# Patient Record
Sex: Male | Born: 1955 | Race: White | Hispanic: No | Marital: Married | Smoking: Former smoker
Health system: Southern US, Community
[De-identification: ages and names within clinical notes are randomized; demographics above are authoritative.]

## PROBLEM LIST (undated history)

## (undated) DIAGNOSIS — F419 Anxiety disorder, unspecified: Secondary | ICD-10-CM

## (undated) DIAGNOSIS — Z5189 Encounter for other specified aftercare: Secondary | ICD-10-CM

## (undated) DIAGNOSIS — I8500 Esophageal varices without bleeding: Secondary | ICD-10-CM

## (undated) DIAGNOSIS — D649 Anemia, unspecified: Secondary | ICD-10-CM

## (undated) DIAGNOSIS — D126 Benign neoplasm of colon, unspecified: Secondary | ICD-10-CM

## (undated) DIAGNOSIS — I10 Essential (primary) hypertension: Secondary | ICD-10-CM

## (undated) DIAGNOSIS — M109 Gout, unspecified: Secondary | ICD-10-CM

## (undated) DIAGNOSIS — K219 Gastro-esophageal reflux disease without esophagitis: Secondary | ICD-10-CM

## (undated) DIAGNOSIS — J069 Acute upper respiratory infection, unspecified: Secondary | ICD-10-CM

## (undated) DIAGNOSIS — K7460 Unspecified cirrhosis of liver: Secondary | ICD-10-CM

## (undated) HISTORY — DX: Anemia, unspecified: D64.9

## (undated) HISTORY — PX: UPPER GI ENDOSCOPY: SHX6162

## (undated) HISTORY — DX: Anxiety disorder, unspecified: F41.9

## (undated) HISTORY — PX: OTHER SURGICAL HISTORY: SHX169

## (undated) HISTORY — DX: Benign neoplasm of colon, unspecified: D12.6

## (undated) HISTORY — DX: Gout, unspecified: M10.9

---

## 2001-09-10 ENCOUNTER — Encounter: Payer: Self-pay | Admitting: Emergency Medicine

## 2001-09-10 ENCOUNTER — Emergency Department (HOSPITAL_COMMUNITY): Admission: EM | Admit: 2001-09-10 | Discharge: 2001-09-10 | Payer: Self-pay | Admitting: Emergency Medicine

## 2008-07-07 ENCOUNTER — Encounter: Payer: Self-pay | Admitting: Emergency Medicine

## 2008-07-07 ENCOUNTER — Inpatient Hospital Stay (HOSPITAL_COMMUNITY): Admission: EM | Admit: 2008-07-07 | Discharge: 2008-07-09 | Payer: Self-pay | Admitting: Internal Medicine

## 2008-07-08 ENCOUNTER — Encounter (INDEPENDENT_AMBULATORY_CARE_PROVIDER_SITE_OTHER): Payer: Self-pay | Admitting: Internal Medicine

## 2009-02-02 ENCOUNTER — Ambulatory Visit (HOSPITAL_COMMUNITY): Admission: RE | Admit: 2009-02-02 | Discharge: 2009-02-02 | Payer: Self-pay | Admitting: Endocrinology

## 2009-02-03 ENCOUNTER — Ambulatory Visit: Admission: RE | Admit: 2009-02-03 | Discharge: 2009-02-03 | Payer: Self-pay | Admitting: Endocrinology

## 2009-02-04 ENCOUNTER — Ambulatory Visit (HOSPITAL_COMMUNITY): Admission: RE | Admit: 2009-02-04 | Discharge: 2009-02-04 | Payer: Self-pay | Admitting: Endocrinology

## 2009-05-20 ENCOUNTER — Emergency Department (HOSPITAL_COMMUNITY): Admission: EM | Admit: 2009-05-20 | Discharge: 2009-05-20 | Payer: Self-pay | Admitting: Emergency Medicine

## 2009-06-24 ENCOUNTER — Emergency Department (HOSPITAL_COMMUNITY): Admission: EM | Admit: 2009-06-24 | Discharge: 2009-06-24 | Payer: Self-pay | Admitting: Emergency Medicine

## 2009-10-01 ENCOUNTER — Emergency Department (HOSPITAL_COMMUNITY): Admission: EM | Admit: 2009-10-01 | Discharge: 2009-10-02 | Payer: Self-pay | Admitting: Emergency Medicine

## 2010-11-28 LAB — HEPATIC FUNCTION PANEL
ALT: 83 U/L — ABNORMAL HIGH (ref 0–53)
AST: 106 U/L — ABNORMAL HIGH (ref 0–37)
Albumin: 4.4 g/dL (ref 3.5–5.2)
Alkaline Phosphatase: 167 U/L — ABNORMAL HIGH (ref 39–117)
Indirect Bilirubin: 0.5 mg/dL (ref 0.3–0.9)

## 2010-11-28 LAB — URINALYSIS, ROUTINE W REFLEX MICROSCOPIC
Hgb urine dipstick: NEGATIVE
Protein, ur: NEGATIVE mg/dL
Specific Gravity, Urine: 1.01 (ref 1.005–1.030)

## 2010-11-28 LAB — ETHANOL: Alcohol, Ethyl (B): 373 mg/dL — ABNORMAL HIGH (ref 0–10)

## 2010-11-28 LAB — BASIC METABOLIC PANEL
BUN: 9 mg/dL (ref 6–23)
CO2: 20 mEq/L (ref 19–32)
Calcium: 8.8 mg/dL (ref 8.4–10.5)
Creatinine, Ser: 0.65 mg/dL (ref 0.4–1.5)
GFR calc Af Amer: >60 mL/min (ref >60)
Glucose, Bld: 137 mg/dL — ABNORMAL HIGH (ref 70–99)
Potassium: 3.8 mEq/L (ref 3.5–5.1)

## 2010-11-28 LAB — DIFFERENTIAL
Basophils Relative: 0 % (ref 0–1)
Eosinophils Absolute: 0.1 10*3/uL (ref 0.0–0.7)
Eosinophils Relative: 1 % (ref 0–5)
Lymphs Abs: 3.6 10*3/uL (ref 0.7–4.0)
Neutrophils Relative %: 47 % (ref 43–77)

## 2010-11-28 LAB — CBC
HCT: 42.5 % (ref 39.0–52.0)
RBC: 5.51 MIL/uL (ref 4.22–5.81)
WBC: 8.1 10*3/uL (ref 4.0–10.5)

## 2010-11-28 LAB — PROTIME-INR: Prothrombin Time: 15.5 seconds — ABNORMAL HIGH (ref 11.6–15.2)

## 2010-11-28 LAB — LIPASE, BLOOD: Lipase: 62 U/L — ABNORMAL HIGH (ref 11–59)

## 2010-12-16 LAB — DIFFERENTIAL
Basophils Absolute: 0 10*3/uL (ref 0.0–0.1)
Basophils Relative: 0 % (ref 0–1)
Lymphs Abs: 1.1 10*3/uL (ref 0.7–4.0)
Monocytes Absolute: 0.5 10*3/uL (ref 0.1–1.0)

## 2010-12-16 LAB — CBC
HCT: 34.9 % — ABNORMAL LOW (ref 39.0–52.0)
MCHC: 31.2 g/dL (ref 30.0–36.0)
Platelets: 97 10*3/uL — ABNORMAL LOW (ref 150–400)
RBC: 5.33 MIL/uL (ref 4.22–5.81)
WBC: 3.9 10*3/uL — ABNORMAL LOW (ref 4.0–10.5)

## 2010-12-16 LAB — COMPREHENSIVE METABOLIC PANEL
Albumin: 4.5 g/dL (ref 3.5–5.2)
Alkaline Phosphatase: 153 U/L — ABNORMAL HIGH (ref 39–117)
BUN: 7 mg/dL (ref 6–23)
CO2: 24 mEq/L (ref 19–32)
Calcium: 9.3 mg/dL (ref 8.4–10.5)
Potassium: 4 mEq/L (ref 3.5–5.1)
Sodium: 138 mEq/L (ref 135–145)
Total Bilirubin: 1.4 mg/dL — ABNORMAL HIGH (ref 0.3–1.2)
Total Protein: 7.8 g/dL (ref 6.0–8.3)

## 2010-12-16 LAB — PROTIME-INR: INR: 1.17 (ref 0.00–1.49)

## 2010-12-17 LAB — CBC
Hemoglobin: 9.9 g/dL — ABNORMAL LOW (ref 13.0–17.0)
MCHC: 30.3 g/dL (ref 30.0–36.0)
MCV: 65.5 fL — ABNORMAL LOW (ref 78.0–100.0)
RBC: 5.01 MIL/uL (ref 4.22–5.81)
WBC: 3.8 10*3/uL — ABNORMAL LOW (ref 4.0–10.5)

## 2010-12-17 LAB — BASIC METABOLIC PANEL
BUN: 8 mg/dL (ref 6–23)
Calcium: 8.9 mg/dL (ref 8.4–10.5)
Chloride: 106 mEq/L (ref 96–112)
GFR calc Af Amer: >60 mL/min (ref >60)
Glucose, Bld: 208 mg/dL — ABNORMAL HIGH (ref 70–99)

## 2010-12-17 LAB — DIFFERENTIAL
Basophils Relative: 1 % (ref 0–1)
Eosinophils Absolute: 0 10*3/uL (ref 0.0–0.7)
Eosinophils Relative: 1 % (ref 0–5)
Monocytes Absolute: 0.3 10*3/uL (ref 0.1–1.0)
Monocytes Relative: 8 % (ref 3–12)

## 2010-12-17 LAB — ETHANOL: Alcohol, Ethyl (B): <5 mg/dL (ref 0–10)

## 2010-12-17 LAB — RAPID URINE DRUG SCREEN, HOSP PERFORMED
Barbiturates: NOT DETECTED
Benzodiazepines: POSITIVE — AB
Opiates: NOT DETECTED

## 2010-12-21 LAB — CROSSMATCH: Antibody Screen: NEGATIVE

## 2011-01-25 NOTE — H&P (Signed)
NAMEMARCELLIUS, MONTAGNA NO.:  [id number]   MEDICAL RECORD NO.:  [id number]          PATIENT TYPE:  INP   LOCATION:  1425                         FACILITY:  [facility]   PHYSICIAN:  Michiel Cowboy, MDDATE OF BIRTH:  November 14, 1955   DATE OF ADMISSION:  07/07/2008  DATE OF DISCHARGE:  07/07/2008                              HISTORY & PHYSICAL   PRIMARY CARE Dyllon Henken:  Dr. Leslie Dales with [facility].   CHIEF COMPLAINT:  Chest pain, shortness of breath and dyspnea on  exertion.   Mr. Parker is a 55 year old gentleman with past medical history of  hyperlipidemia and hypertension and gout who states that for past year  or so he had not been feeling very well.  His last hemoglobin he is  convinced was checked a year ago but I cannot find anything in records,  and even on [facility]__________search.  He does report that starting from  Labor Day about 2 weeks ago he had been having exertional chest pain and  shortness of breath which has been progressively getting worse and  eventually triggered his presentation to [facility] Emergency Department  when he was found to have hemoglobin 6.8 and was admitted directly from  [facility] to [place].  Here the patient denies having any bright  red blood in his stools or black stools.  Otherwise besides chest pain,  shortness of breath had been symptomatic, he reports heavy drinking  about 4 beers in a regular day and about 12 beers on weekends, smokes  about 4 cigarettes per day.  Otherwise social history unremarkable.   PAST MEDICAL HISTORY:  As per above.   FAMILY HISTORY:  Unremarkable.   CURRENT MEDICATIONS:  Include:  1. Allopurinol 300 mg daily.  2. Citalopram 20 daily.  3. Pravastatin 40 daily.  4. Vitamin D.  5. Fish oil.   ALLERGY:  No known drug allergies.   VITALS:  Temperature 98.8.  Heart rate 85.  Respirations 18.  Satting  97% on room air.  Blood pressure 120/72 and not orthostatic.  The  patient  appears to be pale but in no acute distress.  HEAD:  Nontraumatic.  Moist mucous membranes.  LUNGS:  Clear to auscultation bilaterally.  HEART:  There is possibly a flow murmur noted and perhaps an ejection  murmur as well which is not radiating to carotids.  ABDOMEN:  Soft, nontender, nondistended, trace hepatomegaly perhaps  noted.  LOWER EXTREMITIES:  Without clubbing, cyanosis or edema.   Cranial nerves II-XII intact.  Strength 5/5 in all 4 extremities.Marland Kitchen   LABS:  White blood cell count 5.8, hemoglobin 6.1, platelets 92, sed  rate4, reticulocyte percentile 1.7 which is low for this hemoglobin.  INR slightly elevated at 1.4, sodium 138, potassium 3.9, creatinine  0.74.  LFTs within normal limits with total albumin of 4.2 and a total  protein of 6.8.  Cardiac markers within normal limits.  LDH 794.  Total  bili 1.1.  Chest x-ray showed mild cardiomegaly and basilar atelectasis.  MCV noted to be 59.9, Hemoccult. negative.  Blood review showed  possible__________.   ASSESSMENT/PLAN:  This is a 55 year old gentleman with anemia of unclear  etiology and alcohol abuse.  1. Anemia.  Differential for a cause could be iron deficiency.  We      will await results of anemia panel.  Given the very low MCV,      possibility of thalassemia also comes in.  We will need possibly      hemoglobin electrophoresis to further discern as the patient had      not been having any blood per rectum or melena.  This could be very      slow bleed since he had been feeling poorly for a year now.  Given      that this was likely a chronic situation and a heart murmur as well      as cardiomegaly on chest x-ray, there is a possibility that he      developed cardiomyopathy associated with anemia.  Will need a 2-D      echo.  We will also try to evaluate for any reason for low      platelets but those again could be related to underlying process.      We will obtain abdominal ultrasound to evaluate for  hepatomegaly      and splenomegaly given alcohol abuse, given stippling on smear,      other possibilities also involved lead poisoning or having metal      poisoning.  Will also have metal screen.  Will transfuse 2 units of      packed red blood cells.  After all this lab obtained, Hemoccult      stools was Hemoccult-positive with evidence of iron-deficiency      anemia will likely need gastrointestinal workup.  We will hold      allopurinol as this could cause aplastic anemia and could      potentially contribute to picture.  2. History of hyperlipidemia.  Check fasting lipid panel.  Continue      statin.  3. History of gout.  We will hold off allopurinol.  4. Prophylaxis.  We will do Protonix and sequential compression      devices.  Avoid Lovenox until gastrointestinal bleed ruled out.      Michiel Cowboy, MD  Electronically Signed     AVD/MEDQ  D:  07/08/2008  T:  07/08/2008  Job:  [id number]   cc:   Veverly Fells. Altheimer, M.D.  Fax: [phone]

## 2011-06-13 LAB — HEMOGLOBIN A1C
Hgb A1c MFr Bld: 6
Mean Plasma Glucose: 126

## 2011-06-13 LAB — COMPREHENSIVE METABOLIC PANEL
ALT: 18
AST: 25
Albumin: 3.9
Albumin: 4.2
Alkaline Phosphatase: 113
Alkaline Phosphatase: 99
BUN: 8
Chloride: 104
Chloride: 105
Creatinine, Ser: 0.74
GFR calc Af Amer: >60
Glucose, Bld: 96
Potassium: 3.9
Potassium: 3.9
Sodium: 136
Total Bilirubin: 1.1
Total Bilirubin: 1.3 — ABNORMAL HIGH
Total Protein: 6.7

## 2011-06-13 LAB — CBC
HCT: 21.3 — ABNORMAL LOW
HCT: 24.9 — ABNORMAL LOW
HCT: 26.4 — ABNORMAL LOW
Hemoglobin: 6.1 — CL
Hemoglobin: 7.6 — CL
Hemoglobin: 8.2 — ABNORMAL LOW
MCHC: 30.9
MCV: 66.2 — ABNORMAL LOW
Platelets: 90 — ABNORMAL LOW
RBC: 3.56 — ABNORMAL LOW
RBC: 3.99 — ABNORMAL LOW
RDW: 26.4 — ABNORMAL HIGH
WBC: 4.8
WBC: 5.5
WBC: 5.8

## 2011-06-13 LAB — CROSSMATCH: ABO/RH(D): A NEG

## 2011-06-13 LAB — HEMOGLOBINOPATHY EVALUATION
Hemoglobin Other: 0 (ref 0.0–0.0)
Hgb F Quant: 0 (ref 0.0–2.0)

## 2011-06-13 LAB — FOLATE: Folate: 18.4

## 2011-06-13 LAB — LIPID PANEL
HDL: 20 — ABNORMAL LOW
Total CHOL/HDL Ratio: 6.2
Triglycerides: 151 — ABNORMAL HIGH
VLDL: 30

## 2011-06-13 LAB — CARDIAC PANEL(CRET KIN+CKTOT+MB+TROPI)
CK, MB: 0.7
CK, MB: 0.7
Relative Index: INVALID
Total CK: 39
Troponin I: <0.01

## 2011-06-13 LAB — TSH: TSH: 2.168

## 2011-06-13 LAB — DIFFERENTIAL
Eosinophils Relative: 1
Lymphocytes Relative: 30
Lymphs Abs: 1.8
Monocytes Absolute: 0.5
Neutro Abs: 3.5

## 2011-06-13 LAB — ABO/RH: ABO/RH(D): A NEG

## 2011-06-13 LAB — RETICULOCYTES
Retic Count, Absolute: 62.7
Retic Ct Pct: 1.7

## 2011-06-13 LAB — FERRITIN: Ferritin: 5 — ABNORMAL LOW (ref 22–322)

## 2011-06-13 LAB — HEMOGLOBIN AND HEMATOCRIT, BLOOD: Hemoglobin: 7.4 — CL

## 2011-06-13 LAB — PLATELET ANTIBODIES, DIRECT: Platelet IgG Ab, Direct: NEGATIVE

## 2011-06-13 LAB — IRON AND TIBC
TIBC: 551 — ABNORMAL HIGH
UIBC: 540

## 2011-06-14 LAB — BASIC METABOLIC PANEL
CO2: 27
Chloride: 101
Creatinine, Ser: 0.8
GFR calc Af Amer: >60
Glucose, Bld: 81

## 2011-06-14 LAB — CBC
Hemoglobin: 6.8 — CL
MCHC: 29 — ABNORMAL LOW
MCV: 59 — ABNORMAL LOW
RBC: 3.94 — ABNORMAL LOW
RDW: 19.4 — ABNORMAL HIGH

## 2011-06-14 LAB — DIFFERENTIAL
Eosinophils Relative: 1
Lymphs Abs: 1.6
Monocytes Absolute: 0.7
Monocytes Relative: 13 — ABNORMAL HIGH
Neutro Abs: 2.7

## 2011-06-14 LAB — OCCULT BLOOD X 1 CARD TO LAB, STOOL: Fecal Occult Bld: NEGATIVE

## 2011-11-23 ENCOUNTER — Telehealth (HOSPITAL_COMMUNITY): Payer: Self-pay | Admitting: *Deleted

## 2011-11-23 NOTE — ED Notes (Signed)
Pt. called on VM @ 1439 and said he had a problem a few years ago with low Hgb. He has insurance and wants to get it checked. I called pt. Back and said we can check it but if it is low we would have to transfer him to the ED for blood transfusion. He said he is feeling sluggish like he did when it was low before. Denies dizziness or weakness. He said his Hbg. was 6.0 before and had to have a blood transfusion. Does not know why it got low. Denies bleeding. I told him he could come here and we could check it, but with that hx. he should probably go to ED first. Pt. States he can't go now due to work and needs someone to feed his dogs. I advised him to go ASAP. Vassie Moselle 11/23/2011

## 2011-11-29 ENCOUNTER — Emergency Department (HOSPITAL_COMMUNITY)
Admission: EM | Admit: 2011-11-29 | Discharge: 2011-11-29 | Disposition: A | Discharge status: Home / Self Care | Payer: 59 | Attending: Emergency Medicine | Admitting: Emergency Medicine

## 2011-11-29 ENCOUNTER — Encounter (HOSPITAL_COMMUNITY): Payer: Self-pay | Admitting: Emergency Medicine

## 2011-11-29 DIAGNOSIS — I10 Essential (primary) hypertension: Secondary | ICD-10-CM | POA: Insufficient documentation

## 2011-11-29 DIAGNOSIS — D509 Iron deficiency anemia, unspecified: Secondary | ICD-10-CM | POA: Insufficient documentation

## 2011-11-29 DIAGNOSIS — E119 Type 2 diabetes mellitus without complications: Secondary | ICD-10-CM | POA: Insufficient documentation

## 2011-11-29 HISTORY — DX: Essential (primary) hypertension: I10

## 2011-11-29 HISTORY — DX: Encounter for other specified aftercare: Z51.89

## 2011-11-29 LAB — DIFFERENTIAL
Basophils Relative: 0 % (ref 0–1)
Eosinophils Relative: 1 % (ref 0–5)
Lymphocytes Relative: 38 % (ref 12–46)
Monocytes Relative: 12 % (ref 3–12)
Neutro Abs: 1.5 10*3/uL — ABNORMAL LOW (ref 1.7–7.7)

## 2011-11-29 LAB — CBC
HCT: 38 % — ABNORMAL LOW (ref 39.0–52.0)
Hemoglobin: 11.3 g/dL — ABNORMAL LOW (ref 13.0–17.0)
WBC: 3.1 10*3/uL — ABNORMAL LOW (ref 4.0–10.5)

## 2011-11-29 LAB — BASIC METABOLIC PANEL
BUN: 11 mg/dL (ref 6–23)
CO2: 26 mEq/L (ref 19–32)
Chloride: 102 mEq/L (ref 96–112)
GFR calc Af Amer: >90 mL/min (ref >90)
Glucose, Bld: 177 mg/dL — ABNORMAL HIGH (ref 70–99)
Potassium: 3.8 mEq/L (ref 3.5–5.1)

## 2011-11-29 MED ORDER — FERROUS SULFATE 325 (65 FE) MG PO TABS: 325.0000 mg | ORAL_TABLET | Freq: Every day | ORAL | Status: DC

## 2011-11-29 NOTE — Discharge Instructions (Signed)
Use the prescription strength pills as prescribed. Get plenty of rest and drink fluids. Use a resource guide to find a primary care Dr.  Iron Deficiency Anemia There are many types of anemia. Iron deficiency anemia is the most common. Iron deficiency anemia is a decrease in the number of red blood cells caused by too little iron. Without enough iron, your body does not produce enough hemoglobin. Hemoglobin is a substance in red blood cells that carries oxygen to the body's tissues. Iron deficiency anemia may leave you tired and short of breath. CAUSES   Lack of iron in the diet.   This may be seen in infants and children, because there is little iron in milk.   This may be seen in adults who do not eat enough iron-rich foods.   This may be seen in pregnant or breastfeeding women who do not take iron supplements. There is a much higher need for iron intake at these times.   Poor absorption of iron, as seen with intestinal disorders.   Intestinal bleeding.   Heavy periods.  SYMPTOMS  Mild anemia may not be noticeable. Symptoms may include:  Fatigue.   Headache.   Pale skin.   Weakness.   Shortness of breath.   Dizziness.   Cold hands and feet.   Fast or irregular heartbeat.  DIAGNOSIS  Diagnosis requires a thorough evaluation and physical exam by your caregiver.  Blood tests are generally used to confirm iron deficiency anemia.   Additional tests may be done to find the underlying cause of your anemia. These may include:   Testing for blood in the stool (fecal occult blood test).   A procedure to see inside the colon and rectum (colonoscopy).   A procedure to see inside the esophagus and stomach (endoscopy).  TREATMENT   Correcting the cause of the iron deficiency is the first step.   Medicines, such as oral contraceptives, can make heavy menstrual flows lighter.   Antibiotics and other medicines can be used to treat peptic ulcers.   Surgery may be needed to  remove a bleeding polyp, tumor, or fibroid.   Often, iron supplements (ferrous sulfate) are taken.   For the best iron absorption, take these supplements with an empty stomach.   You may need to take the supplements with food if you cannot tolerate them on an empty stomach. Vitamin C improves the absorption of iron. Your caregiver may recommend taking your iron tablets with a glass of orange juice or vitamin C supplement.   Milk and antacids should not be taken at the same time as iron supplements. They may interfere with the absorption of iron.   Iron supplements can cause constipation. A stool softener is often recommended.   Pregnant and breastfeeding women will need to take extra iron, because their normal diet usually will not provide the required amount.   Patients who cannot tolerate iron by mouth can take it through a vein (intravenously) or by an injection into the muscle.  HOME CARE INSTRUCTIONS   Ask your dietitian for help with diet questions.   Take iron and vitamins as directed by your caregiver.   Eat a diet rich in iron. Eat liver, lean beef, whole-grain bread, eggs, dried fruit, and dark green leafy vegetables.  SEEK IMMEDIATE MEDICAL CARE IF:   You have a fainting episode. Do not drive yourself. Call your local emergency services (911 in [place] if no other help is available.   You have chest pain, nausea, or  vomiting.   You develop severe or increased shortness of breath with activities.   You develop weakness or increased thirst.   You have a rapid heartbeat.   You develop unexplained sweating or become lightheaded when getting up from a chair or bed.  MAKE SURE YOU:   Understand these instructions.   Will watch your condition.   Will get help right away if you are not doing well or get worse.  Document Released: 08/26/2000 Document Revised: 08/18/2011 Document Reviewed: 01/05/2010 [facility] Patient Information 2012 [place], [place].

## 2011-11-29 NOTE — ED Provider Notes (Signed)
History     CSN: [id number]  Arrival date & time 11/29/11  0730   First MD Initiated Contact with Patient 11/29/11 [phone]      Chief Complaint  Patient presents with  . Weakness    (Consider location/radiation/quality/duration/timing/severity/associated sxs/prior treatment) Patient is a 56 y.o. male presenting with weakness. The history is provided by the patient.  Weakness Primary symptoms do not include headaches, syncope, altered mental status, dizziness, visual change, focal weakness, fever, nausea or vomiting. The symptoms began more than 1 week ago. The symptoms are unchanged. The neurological symptoms are diffuse.  Additional symptoms include weakness.   he is worried that his blood count is low. He had blood transfusion several years ago. No known source for bleeding. He denies hematemesis, hematochezia, traumatic blood loss. He has an endocrinologist and had routine blood work in January 2013 with hemoglobin 10. He has no primary care doctor.  Past Medical History  Diagnosis Date  . Blood transfusion   . Hypertension   . Diabetes mellitus     History reviewed. No pertinent past surgical history.  History reviewed. No pertinent family history.  History  Substance Use Topics  . Smoking status: Not on file  . Smokeless tobacco: Not on file  . Alcohol Use: Yes      Review of Systems  Constitutional: Negative for fever.  Cardiovascular: Negative for syncope.  Gastrointestinal: Negative for nausea and vomiting.  Neurological: Positive for weakness. Negative for dizziness, focal weakness and headaches.  Psychiatric/Behavioral: Negative for altered mental status.  All other systems reviewed and are negative.    Allergies  Review of patient's allergies indicates no known allergies.  Home Medications   Current Outpatient Rx  Name Route Sig Dispense Refill  . ALLOPURINOL 300 MG PO TABS Oral Take 300 mg by mouth daily.    Marland Kitchen ALPRAZOLAM 0.5 MG PO TABS Oral Take 0.5  mg by mouth at bedtime as needed.    Marland Kitchen LOSARTAN POTASSIUM 100 MG PO TABS Oral Take 100 mg by mouth daily.    Marland Kitchen METOPROLOL SUCCINATE ER 50 MG PO TB24 Oral Take 50 mg by mouth daily. Take with or immediately following a meal.    . OMEPRAZOLE MAGNESIUM 20 MG PO TBEC Oral Take 20 mg by mouth daily.    Marland Kitchen PIOGLITAZONE HCL 30 MG PO TABS Oral Take 30 mg by mouth daily.    Marland Kitchen FERROUS SULFATE 325 (65 FE) MG PO TABS Oral Take 1 tablet (325 mg total) by mouth daily. 30 tablet 0    BP 151/79  Pulse 85  Temp(Src) 98 F (36.7 C) (Oral)  Resp 20  SpO2 100%  Physical Exam  Nursing note and vitals reviewed. Constitutional: He is oriented to person, place, and time. He appears well-developed and well-nourished.  HENT:  Head: Normocephalic and atraumatic.  Right Ear: External ear normal.  Left Ear: External ear normal.  Eyes: Conjunctivae and EOM are normal. Pupils are equal, round, and reactive to light.  Neck: Normal range of motion and phonation normal. Neck supple.  Cardiovascular: Normal rate, regular rhythm, normal heart sounds and intact distal pulses.   Pulmonary/Chest: Effort normal and breath sounds normal. He exhibits no bony tenderness.  Abdominal: Soft. Normal appearance. There is no tenderness.  Musculoskeletal: Normal range of motion.  Neurological: He is alert and oriented to person, place, and time. He has normal strength. No cranial nerve deficit or sensory deficit. He exhibits normal muscle tone. Coordination normal.  Skin: Skin is warm, dry  and intact.  Psychiatric: He has a normal mood and affect. His behavior is normal. Judgment and thought content normal.    ED Course  Procedures (including critical care time)  Labs Reviewed  CBC - Abnormal; Notable for the following:    WBC 3.1 (*)    Hemoglobin 11.3 (*)    HCT 38.0 (*)    MCV 74.4 (*)    MCH 22.1 (*)    MCHC 29.7 (*)    RDW 19.9 (*)    Platelets 102 (*)    All other components within normal limits  DIFFERENTIAL -  Abnormal; Notable for the following:    Neutro Abs 1.5 (*)    All other components within normal limits  BASIC METABOLIC PANEL - Abnormal; Notable for the following:    Glucose, Bld 177 (*)    All other components within normal limits  URINALYSIS, ROUTINE W REFLEX MICROSCOPIC   No results found.   1. Anemia, iron deficiency       MDM  Nonspecific fatigue with iron deficiency anemia. No sign for occult infection, metabolic instability or suspected central process. He is stable for discharge.  Plan: Home Medications- Iron-increased dose; Home Treatments- rest, fluids; Recommended follow up- PCP f/u asap (needs to find one)        Flint Melter, MD 11/29/11 [phone]

## 2011-11-29 NOTE — ED Notes (Signed)
Pt states that a few years ago he had this same feeling of weakness and had to receive blood. Had a colonostomy done and it was ok. States he does drink beer at times. No pain at this time.

## 2011-11-29 NOTE — ED Notes (Signed)
C/o fatigue for three months that has increased assocaiated with SOB with exertion. Denies chest pain. Pt is concerned he may have low  Hemoglobin.

## 2012-09-11 ENCOUNTER — Ambulatory Visit: Payer: 59 | Admitting: Family Medicine

## 2012-09-11 VITALS — BP 132/80 | HR 86 | Temp 99.1°F | Resp 16 | Ht 69.0 in | Wt 206.0 lb

## 2012-09-11 DIAGNOSIS — J40 Bronchitis, not specified as acute or chronic: Secondary | ICD-10-CM

## 2012-09-11 DIAGNOSIS — R05 Cough: Secondary | ICD-10-CM

## 2012-09-11 DIAGNOSIS — H6120 Impacted cerumen, unspecified ear: Secondary | ICD-10-CM

## 2012-09-11 MED ORDER — HYDROCODONE-HOMATROPINE 5-1.5 MG/5ML PO SYRP: 5.0000 mL | ORAL_SOLUTION | Freq: Four times a day (QID) | ORAL | Status: DC | PRN

## 2012-09-11 MED ORDER — AZITHROMYCIN 250 MG PO TABS: ORAL_TABLET | ORAL | Status: DC

## 2012-09-11 NOTE — Progress Notes (Signed)
Subjective:    Patient ID: Jake [name], male    DOB: 05-28-1956, 56 y.o.   MRN: [id number] Chief Complaint  Patient presents with  . Cough    productive cough x 5 days  . Fever  . Nasal Congestion    chest congestion  . Headache    HPI  Jake [name] is a 56 yo male who presents with 1 wk of acute illness. Initially thought that this was just a cold bu it is not getting any better. Sxs are especially bad at night and first thing in the morning with coughing up green mucous, burning in chest w/ coughing, incraesed pressure in eyes and in head. Has felt like fever/chills/sweats/no energy. + HAs, no sinus pressure, but feels ears popping,  Pharyngitis resolving. A little bit of achiness. Did have flu shot this yr.   Past Medical History  Diagnosis Date  . Blood transfusion   . Hypertension   . Diabetes mellitus    Current Outpatient Prescriptions on File Prior to Visit  Medication Sig Dispense Refill  . allopurinol (ZYLOPRIM) 300 MG tablet Take 300 mg by mouth daily.      . ferrous sulfate 325 (65 FE) MG tablet Take 1 tablet (325 mg total) by mouth daily.  30 tablet  0  . omeprazole (PRILOSEC OTC) 20 MG tablet Take 20 mg by mouth daily.      Marland Kitchen ALPRAZolam (XANAX) 0.5 MG tablet Take 0.5 mg by mouth at bedtime as needed.      Marland Kitchen losartan (COZAAR) 100 MG tablet Take 100 mg by mouth daily.      . metoprolol succinate (TOPROL-XL) 50 MG 24 hr tablet Take 50 mg by mouth daily. Take with or immediately following a meal.      . pioglitazone (ACTOS) 30 MG tablet Take 30 mg by mouth daily.       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 Review of Systems  Constitutional: Positive for fever, chills, diaphoresis, activity change, appetite change and fatigue. Negative for unexpected weight change.  HENT: Positive for ear pain, congestion, sore throat, rhinorrhea and postnasal drip. Negative for sneezing, neck stiffness, sinus pressure and  ear discharge.   Eyes: Positive for pain. Negative for visual disturbance.  Respiratory: Positive for cough. Negative for shortness of breath.   Cardiovascular: Positive for chest pain.  Gastrointestinal: Negative for nausea, vomiting, abdominal pain, diarrhea and constipation.  Genitourinary: Negative for dysuria.  Musculoskeletal: Positive for myalgias. Negative for joint swelling, arthralgias and gait problem.  Skin: Negative for rash.  Neurological: Positive for headaches. Negative for syncope and weakness.  Hematological: Positive for adenopathy.  Psychiatric/Behavioral: Positive for sleep disturbance.      BP 132/80  Pulse 86  Temp 99.1 F (37.3 C) (Oral)  Resp 16  Ht 5\' 9"  (1.753 m)  Wt 206 lb (93.441 kg)  BMI 30.42 kg/m2  SpO2 97% Objective:   Physical Exam  Constitutional: He is oriented to person, place, and time. He appears well-developed and well-nourished. No distress.  HENT:  Head: Normocephalic and atraumatic.  Right Ear: External ear normal.  Left Ear: External ear normal.  Nose: Mucosal edema and rhinorrhea present.  Mouth/Throat: Uvula is midline and mucous membranes are normal. Posterior oropharyngeal erythema present. No oropharyngeal exudate or posterior oropharyngeal edema.       Bilateral canals occluded w/ soft brown cerumen  Eyes: Conjunctivae normal are normal. No scleral icterus.  Neck: Normal  range of motion. Neck supple. No thyromegaly present.  Cardiovascular: Normal rate, regular rhythm, normal heart sounds and intact distal pulses.   Pulmonary/Chest: Effort normal and breath sounds normal. No respiratory distress.  Abdominal: Soft. Bowel sounds are normal. He exhibits no distension and no mass. There is no tenderness. There is no rebound and no guarding.  Musculoskeletal: He exhibits no edema.  Lymphadenopathy:       Head (right side): Submandibular adenopathy present.       Head (left side): Submandibular adenopathy present.    He has no  cervical adenopathy.       Right: No supraclavicular adenopathy present.       Left: No supraclavicular adenopathy present.  Neurological: He is alert and oriented to person, place, and time.  Skin: Skin is warm and dry. He is not diaphoretic. No erythema.  Psychiatric: He has a normal mood and affect. His behavior is normal.      Results for orders placed in visit on 09/11/12  POCT INFLUENZA A/B      Component Value Range   Influenza A, POC Negative     Influenza B, POC Negative      Assessment & Plan:   Cough - Plan: POCT Influenza A/B  Bronchitis - Plan: azithromycin (ZITHROMAX) 250 MG tablet, HYDROcodone-homatropine (HYCODAN) 5-1.5 MG/5ML syrup  Bilateral Cerumen impaction - Advise pt to return for lavage - declines today due to illness.  Meds ordered this encounter  Medications  .  azithromycin (ZITHROMAX) 250 MG tablet    Sig: Take 2 tabs PO x 1 dose, then 1 tab PO QD x 4 days    Dispense:  6 tablet    Refill:  0  .  HYDROcodone-homatropine (HYCODAN) 5-1.5 MG/5ML syrup    Sig: Take 5 mLs by mouth every 6 (six) hours as needed for cough.    Dispense:  240 mL    Refill:  0

## 2012-11-21 ENCOUNTER — Encounter (HOSPITAL_COMMUNITY): Payer: Self-pay

## 2012-11-21 ENCOUNTER — Emergency Department (HOSPITAL_COMMUNITY)
Admission: EM | Admit: 2012-11-21 | Discharge: 2012-11-22 | Disposition: A | Discharge status: Home / Self Care | Payer: 59 | Attending: Emergency Medicine | Admitting: Emergency Medicine

## 2012-11-21 DIAGNOSIS — I10 Essential (primary) hypertension: Secondary | ICD-10-CM | POA: Insufficient documentation

## 2012-11-21 DIAGNOSIS — K920 Hematemesis: Secondary | ICD-10-CM | POA: Insufficient documentation

## 2012-11-21 DIAGNOSIS — R21 Rash and other nonspecific skin eruption: Secondary | ICD-10-CM | POA: Insufficient documentation

## 2012-11-21 DIAGNOSIS — L509 Urticaria, unspecified: Secondary | ICD-10-CM | POA: Insufficient documentation

## 2012-11-21 DIAGNOSIS — Z79899 Other long term (current) drug therapy: Secondary | ICD-10-CM | POA: Insufficient documentation

## 2012-11-21 DIAGNOSIS — R112 Nausea with vomiting, unspecified: Secondary | ICD-10-CM | POA: Insufficient documentation

## 2012-11-21 DIAGNOSIS — E119 Type 2 diabetes mellitus without complications: Secondary | ICD-10-CM | POA: Insufficient documentation

## 2012-11-21 MED ORDER — FAMOTIDINE IN NACL 20-0.9 MG/50ML-% IV SOLN
20.0000 mg | Freq: Once | INTRAVENOUS | Status: AC
  Administered 2012-11-21: 20 mg via INTRAVENOUS
  Filled 2012-11-21: qty 50

## 2012-11-21 MED ORDER — DIPHENHYDRAMINE HCL 50 MG/ML IJ SOLN
25.0000 mg | Freq: Once | INTRAMUSCULAR | Status: AC
  Administered 2012-11-21: 25 mg via INTRAVENOUS
  Filled 2012-11-21: qty 1

## 2012-11-21 MED ORDER — METHYLPREDNISOLONE SODIUM SUCC 125 MG IJ SOLR
125.0000 mg | Freq: Once | INTRAMUSCULAR | Status: AC
  Administered 2012-11-21: 125 mg via INTRAVENOUS
  Filled 2012-11-21: qty 2

## 2012-11-21 MED ORDER — SODIUM CHLORIDE 0.9 % IV SOLN
INTRAVENOUS | Status: DC
  Administered 2012-11-22: via INTRAVENOUS

## 2012-11-21 NOTE — ED Notes (Signed)
MD at bedside. 

## 2012-11-21 NOTE — ED Provider Notes (Signed)
History     CSN: [id number]  Arrival date & time 11/21/12  2021   First MD Initiated Contact with Patient 11/21/12 2119      Chief Complaint  Patient presents with  . Allergic Reaction    (Consider location/radiation/quality/duration/timing/severity/associated sxs/prior treatment) HPI Comments: Jake [name] is a 57 y.o. Male who began to itch 6 days ago. He has a rash on his buttocks and legs. He also noticed that his voice was hoarse today. He took 2 Benadryl yesterday with partial relief. He did not take any Benadryl today. This evening before coming here. He ate a hamburger with onions that "tasted bad." He denies shortness of breath, wheezing, cough, fever, chills, nausea, vomiting, weakness, or dizziness. He's never had this previously. There are no other known modifying factors.  Patient is a 57 y.o. male presenting with allergic reaction. The history is provided by the patient.  Allergic Reaction    Past Medical History  Diagnosis Date  . Blood transfusion   . Hypertension   . Diabetes mellitus     History reviewed. No pertinent past surgical history.  History reviewed. No pertinent family history.  History  Substance Use Topics  . Smoking status: Never Smoker   . Smokeless tobacco: Not on file  . Alcohol Use: Yes     Comment: frequent      Review of Systems  All other systems reviewed and are negative.    Allergies  Review of patient's allergies indicates no known allergies.  Home Medications   Current Outpatient Rx  Name  Route  Sig  Dispense  Refill  . allopurinol (ZYLOPRIM) 300 MG tablet   Oral   Take 300 mg by mouth daily.         Marland Kitchen ALPRAZolam (XANAX) 0.5 MG tablet   Oral   Take 0.5 mg by mouth at bedtime as needed (sleep/anxiety).          Marland Kitchen doxylamine, Sleep, (UNISOM) 25 MG tablet   Oral   Take 25 mg by mouth at bedtime as needed (sleep/restless).         . ferrous sulfate 325 (65 FE) MG tablet   Oral   Take 325 mg by mouth  daily.         Marland Kitchen losartan (COZAAR) 100 MG tablet   Oral   Take 100 mg by mouth daily.         . metoprolol succinate (TOPROL-XL) 50 MG 24 hr tablet   Oral   Take 50 mg by mouth daily. Take with or immediately following a meal.         . Multiple Vitamin (MULTIVITAMIN WITH MINERALS) TABS   Oral   Take 1 tablet by mouth daily.         Marland Kitchen omeprazole (PRILOSEC OTC) 20 MG tablet   Oral   Take 20 mg by mouth daily.         . predniSONE (DELTASONE) 20 MG tablet   Oral   Take 1 tablet (20 mg total) by mouth 2 (two) times daily.   10 tablet   0     BP 123/70  Pulse 114  Temp(Src) 99.6 F (37.6 C) (Oral)  Resp 18  SpO2 98%  Physical Exam  Nursing note and vitals reviewed. Constitutional: He is oriented to person, place, and time. He appears well-developed and well-nourished.  HENT:  Head: Normocephalic and atraumatic.  Right Ear: External ear normal.  Left Ear: External ear normal.  Eyes: Conjunctivae and  EOM are normal. Pupils are equal, round, and reactive to light.  Neck: Normal range of motion and phonation normal. Neck supple.  Cardiovascular: Normal rate, regular rhythm, normal heart sounds and intact distal pulses.   Pulmonary/Chest: Effort normal and breath sounds normal. No respiratory distress. He has no wheezes. He has no rales. He exhibits no tenderness and no bony tenderness.  Abdominal: Soft. Normal appearance. There is no tenderness.  Musculoskeletal: Normal range of motion.  Neurological: He is alert and oriented to person, place, and time. He has normal strength. No cranial nerve deficit or sensory deficit. He exhibits normal muscle tone. Coordination normal.  Skin: Skin is warm, dry and intact.  Scattered urticaria of the lower extremities. There are some excoriations on his scalp that he states are from wearing a "tight hat".  Psychiatric: He has a normal mood and affect. His behavior is normal. Judgment and thought content normal.    ED Course   Procedures (including critical care time)  ED treatment: IV Solu-Medrol, IV, Benadryl IV Pepcid. Reevaluation: 23:45- the patient reportedly felt better after the above treatment. He then got up to walk to the bathroom, felt nauseated, came back to his room and vomited blood without food particles or gastric  Fluid. He felt better after vomiting.  01:35- he is now tolerating ice chips. Vital signs rechecked  Labs Reviewed  CBC WITH DIFFERENTIAL - Abnormal; Notable for the following:    Hemoglobin 11.7 (*)    HCT 36.7 (*)    RDW 15.6 (*)    Platelets 101 (*)    Neutrophils Relative 86 (*)    Lymphs Abs 0.6 (*)    Monocytes Relative 2 (*)    All other components within normal limits  BASIC METABOLIC PANEL - Abnormal; Notable for the following:    Sodium 133 (*)    Glucose, Bld 187 (*)    All other components within normal limits   Nursing Notes Reviewed/ Care Coordinated, and agree without changes. Applicable Imaging Reviewed Interpretation of Laboratory Data incorporated into ED treatment   1. Urticaria   2. Vomiting   3. Hematemesis      MDM  Urticaria, cause not clear. On associated vomiting x1 with blood. Because of the vomiting, is not clear. He is hemodynamically stable. He does not have known risk for gastric ulcer, esophageal varices, or bleeding of the respiratory tract or airway. Doubt metabolic instability, serious bacterial infection or impending vascular collapse; the patient is stable for discharge.    Plan: Home Medications- prednisone, Benadryl, Pepcid; Home Treatments- gradually advance diet; Recommended follow up- PCP or here, as needed.     Flint Melter, MD 11/22/12 [phone]

## 2012-11-21 NOTE — ED Notes (Addendum)
Pt ambulated to bathroom.  Upon return to room, pt became nauseated and vomited blood.  Notified MD of emesis.  Placed back on monitor.  HR elevated otherwise vitals stable.

## 2012-11-21 NOTE — ED Notes (Signed)
Per pt, started noticing itching rash on Friday.  Pt with rash to thighs, arms and back.  Pt now with changes in voice and some difficulty swallowing.  No difficulty breathing.  No new meds/foods.

## 2012-11-22 LAB — BASIC METABOLIC PANEL
CO2: 20 mEq/L (ref 19–32)
Calcium: 8.8 mg/dL (ref 8.4–10.5)
Glucose, Bld: 187 mg/dL — ABNORMAL HIGH (ref 70–99)
Sodium: 133 mEq/L — ABNORMAL LOW (ref 135–145)

## 2012-11-22 LAB — CBC WITH DIFFERENTIAL/PLATELET
Eosinophils Relative: 0 % (ref 0–5)
HCT: 36.7 % — ABNORMAL LOW (ref 39.0–52.0)
Lymphocytes Relative: 12 % (ref 12–46)
Lymphs Abs: 0.6 10*3/uL — ABNORMAL LOW (ref 0.7–4.0)
MCV: 84.6 fL (ref 78.0–100.0)
Platelets: 101 10*3/uL — ABNORMAL LOW (ref 150–400)
RBC: 4.34 MIL/uL (ref 4.22–5.81)
WBC: 4.7 10*3/uL (ref 4.0–10.5)

## 2012-11-22 MED ORDER — PREDNISONE 20 MG PO TABS: 20.0000 mg | ORAL_TABLET | Freq: Two times a day (BID) | ORAL | Status: DC

## 2012-11-22 MED ORDER — LORAZEPAM 2 MG/ML IJ SOLN
1.0000 mg | Freq: Once | INTRAMUSCULAR | Status: AC
  Administered 2012-11-22: 1 mg via INTRAVENOUS
  Filled 2012-11-22: qty 1

## 2012-11-28 ENCOUNTER — Ambulatory Visit (INDEPENDENT_AMBULATORY_CARE_PROVIDER_SITE_OTHER): Payer: 59 | Admitting: Family Medicine

## 2012-11-28 VITALS — BP 120/54 | HR 100 | Temp 97.9°F | Resp 16 | Ht 69.0 in | Wt 197.0 lb

## 2012-11-28 DIAGNOSIS — G4700 Insomnia, unspecified: Secondary | ICD-10-CM | POA: Insufficient documentation

## 2012-11-28 DIAGNOSIS — K922 Gastrointestinal hemorrhage, unspecified: Secondary | ICD-10-CM | POA: Insufficient documentation

## 2012-11-28 DIAGNOSIS — F411 Generalized anxiety disorder: Secondary | ICD-10-CM | POA: Insufficient documentation

## 2012-11-28 DIAGNOSIS — R195 Other fecal abnormalities: Secondary | ICD-10-CM

## 2012-11-28 DIAGNOSIS — I10 Essential (primary) hypertension: Secondary | ICD-10-CM

## 2012-11-28 DIAGNOSIS — E119 Type 2 diabetes mellitus without complications: Secondary | ICD-10-CM

## 2012-11-28 LAB — POCT CBC
Granulocyte percent: 62.3 % (ref 37–80)
HCT, POC: 23.6 % — AB (ref 43.5–53.7)
Hemoglobin: 6.9 g/dL — AB (ref 14.1–18.1)
Lymph, poc: 1.3 (ref 0.6–3.4)
MCH, POC: 25.4 pg — AB (ref 27–31.2)
MCHC: 29.2 g/dL — AB (ref 31.8–35.4)
MCV: 86.7 fL (ref 80–97)
MID (cbc): 0.4 (ref 0–0.9)
MPV: 9 fL (ref 0–99.8)
POC Granulocyte: 2.7 (ref 2–6.9)
POC LYMPH PERCENT: 29.3 % (ref 10–50)
POC MID %: 8.4 % (ref 0–12)
Platelet Count, POC: 159 10*3/uL (ref 142–424)
RBC: 2.72 M/uL — AB (ref 4.69–6.13)
RDW, POC: 17.6 %
WBC: 4.3 10*3/uL — AB (ref 4.6–10.2)

## 2012-11-28 LAB — POCT GLYCOSYLATED HEMOGLOBIN (HGB A1C): Hemoglobin A1C: 5.7

## 2012-11-28 LAB — TSH: TSH: 2.499 u[IU]/mL (ref 0.350–4.500)

## 2012-11-28 LAB — IFOBT (OCCULT BLOOD): IFOBT: POSITIVE

## 2012-11-28 MED ORDER — ALPRAZOLAM 0.5 MG PO TABS: 0.5000 mg | ORAL_TABLET | Freq: Every evening | ORAL | Status: DC | PRN

## 2012-11-28 MED ORDER — ZOLPIDEM TARTRATE 10 MG PO TABS: 10.0000 mg | ORAL_TABLET | Freq: Every evening | ORAL | Status: DC | PRN

## 2012-11-28 NOTE — Progress Notes (Signed)
[facility]:  Office Visit  Chief Complaint:  Chief Complaint  Patient presents with  . Insomnia  . Establish Care    PCP    HPI: Jake [name] is a 57 y.o. male who complains of multiple reasons: 1. Wants to establish care here. Previously being followed by Dr. Casimiro Needle Al[place], now with [facility]. He does not want to be with [facility].  2. Type 2 DM-He was dx 3 years ago, was eating and drinking poorly, he has not changed his diet. He does Aeronautical engineer. HBa1c? He was put on Januvia, then he was on Januvia and Actos, but Januvia was too expensive so was put only on Actos. No kidney problems. Last use of actos > 6 months. No polydipsia,polyuria.  3. Insomnia-wife gets up at 4 am to get to work by 6 am. Has problems falling asleep. He tosses and turns from side to side, he then gets up at 4 with wife regardless if he is going to work or not. He is a light sleep. He does not have problems staying asleep. He was given [place] and that worked. He has been taking OTC generic benadryl without any relief.  4. Refill on meds 5. Took Losartan 100mg  daily today, he has stopped taking metoprolol XR 2 weeks. He has some dizziness.   Past Medical History  Diagnosis Date  . Blood transfusion   . Hypertension   . Diabetes mellitus   . Anemia   . Anxiety   . Gout    Past Surgical History  Procedure Laterality Date  . Colonoscopy      2011-[facility]. Normal results per patient   History   Social History  . Marital Status: Married    Spouse Name: N/A    Number of Children: N/A  . Years of Education: N/A   Social History Main Topics  . Smoking status: Never Smoker   . Smokeless tobacco: None  . Alcohol Use: 7.2 oz/week    12 Cans of beer per week     Comment: frequent  . Drug Use: No  . Sexually Active: Yes   Other Topics Concern  . None   Social History Narrative  . None   Family History  Problem Relation Age of Onset  . Hyperlipidemia Mother    No Known  Allergies Prior to Admission medications   Medication Sig Start Date End Date Taking? Authorizing Provider  allopurinol (ZYLOPRIM) 300 MG tablet Take 300 mg by mouth daily.   Yes Historical Provider, MD  ferrous sulfate 325 (65 FE) MG tablet Take 325 mg by mouth daily. 11/29/11 11/28/12 Yes Flint Melter, MD  losartan (COZAAR) 100 MG tablet Take 100 mg by mouth daily.   Yes Historical Provider, MD  Multiple Vitamin (MULTIVITAMIN WITH MINERALS) TABS Take 1 tablet by mouth daily.   Yes Historical Provider, MD  omeprazole (PRILOSEC OTC) 20 MG tablet Take 20 mg by mouth daily.   Yes Historical Provider, MD  ALPRAZolam Prudy Feeler) 0.5 MG tablet Take 0.5 mg by mouth at bedtime as needed (sleep/anxiety).     Historical Provider, MD  doxylamine, Sleep, (UNISOM) 25 MG tablet Take 25 mg by mouth at bedtime as needed (sleep/restless).    Historical Provider, MD  metoprolol succinate (TOPROL-XL) 50 MG 24 hr tablet Take 50 mg by mouth daily. Take with or immediately following a meal.    Historical Provider, MD  predniSONE (DELTASONE) 20 MG tablet Take 1 tablet (20 mg total) by mouth 2 (two)  times daily. 11/22/12   Flint Melter, MD     ROS: The patient denies fevers, chills, night sweats, unintentional weight loss, chest pain, palpitations, wheezing, dyspnea on exertion, nausea, vomiting, abdominal pain, dysuria, hematuria, melena, numbness, weakness, or tingling.   All other systems have been reviewed and were otherwise negative with the exception of those mentioned in the HPI and as above.    PHYSICAL EXAM: Filed Vitals:   11/28/12 1440  BP: 120/54  Pulse: 100  Temp: 97.9 F (36.6 C)  Resp: 16   Filed Vitals:   11/28/12 1440  Height: 5\' 9"  (1.753 m)  Weight: 197 lb (89.359 kg)   Body mass index is 29.08 kg/(m^2).  General: Alert, no acute distress HEENT:  Normocephalic, atraumatic, oropharynx patent. EOMI, PERRLA + pale conjunctiva Cardiovascular:  Regular rate and rhythm, no rubs murmurs or  gallops.  No Carotid bruits, radial pulse intact. No pedal edema.  Respiratory: Clear to auscultation bilaterally.  No wheezes, rales, or rhonchi.  No cyanosis, no use of accessory musculature GI: No organomegaly, abdomen is soft and non-tender, positive bowel sounds.  No masses. Skin: No rashes. Neurologic: Facial musculature symmetric. Psychiatric: Patient is appropriate throughout our interaction. Lymphatic: No cervical lymphadenopathy Musculoskeletal: Gait intact.   LABS: Results for orders placed in visit on 11/28/12  POCT CBC      Result Value Range   WBC 4.3 (*) 4.6 - 10.2 K/uL   Lymph, poc 1.3  0.6 - 3.4   POC LYMPH PERCENT 29.3  10 - 50 %L   MID (cbc) 0.4  0 - 0.9   POC MID % 8.4  0 - 12 %M   POC Granulocyte 2.7  2 - 6.9   Granulocyte percent 62.3  37 - 80 %G   RBC 2.72 (*) 4.69 - 6.13 M/uL   Hemoglobin 6.9 (*) 14.1 - 18.1 g/dL   HCT, POC 11.9 (*) 14.7 - 53.7 %   MCV 86.7  80 - 97 fL   MCH, POC 25.4 (*) 27 - 31.2 pg   MCHC 29.2 (*) 31.8 - 35.4 g/dL   RDW, POC 82.9     Platelet Count, POC 159  142 - 424 K/uL   MPV 9.0  0 - 99.8 fL  POCT GLYCOSYLATED HEMOGLOBIN (HGB A1C)      Result Value Range   Hemoglobin A1C 5.7    IFOBT (OCCULT BLOOD)      Result Value Range   IFOBT Positive       EKG/XRAY:   Primary read interpreted by Dr. Conley Rolls at [facility].   ASSESSMENT/PLAN: Encounter Diagnoses  Name Primary?  Marland Kitchen Unspecified essential hypertension   . Diabetes   . Insomnia   . Acute GI bleeding Yes  . Generalized anxiety disorder   . Positive occult stool blood test     Patient declined going to Er today, he will do it tomorrow morning for acute GIB. Precautions given. Risks and benefits given.  He denies any nausea/vomiting, hematuria, melena. No recent NSAIDs.  EGD 2-3 years ago + reflux, take Prilosec 20 mg BID Colonoscopy 2-3 years ago at [facility] was normal Will hold HTN meds until etiology GIB resolved. Patient is dizzy. BP is low to normal. Hemosure was + Patient  does not have DM currently based on HbA1c, he has been off meds for over 6 months-if he had Dm then it is diet controlled Will rx Ambien for insomnia Will rx Xanax for anxiety ( 1/2 tab prn but not while  taking ambien, hold for now) F/u in 2 weeks with Dr. Clelia Croft after he gets evaluated in ER.  Patient and wife undersatnd risks and benefits, my concerns. They decline going to Er tonight for emergent evaluation of GIB but will go tomorrow AM.  Wife was advised to drive home.     Natavia Sublette PHUONG, DO 11/28/2012 5:02 PM

## 2012-11-28 NOTE — Patient Instructions (Signed)

## 2012-11-29 ENCOUNTER — Encounter (HOSPITAL_COMMUNITY): Payer: Self-pay | Admitting: *Deleted

## 2012-11-29 ENCOUNTER — Telehealth: Payer: Self-pay | Admitting: Family Medicine

## 2012-11-29 ENCOUNTER — Inpatient Hospital Stay (HOSPITAL_COMMUNITY)
Admission: EM | Admit: 2012-11-29 | Discharge: 2012-11-30 | DRG: 432 | Disposition: A | Discharge status: Home / Self Care | Payer: 59 | Attending: Internal Medicine | Admitting: Internal Medicine

## 2012-11-29 ENCOUNTER — Encounter (HOSPITAL_COMMUNITY)
Admission: EM | Disposition: A | Discharge status: Home / Self Care | Payer: Self-pay | Source: Home / Self Care | Attending: Internal Medicine

## 2012-11-29 ENCOUNTER — Encounter: Payer: Self-pay | Admitting: Family Medicine

## 2012-11-29 DIAGNOSIS — K766 Portal hypertension: Secondary | ICD-10-CM | POA: Diagnosis present

## 2012-11-29 DIAGNOSIS — Z23 Encounter for immunization: Secondary | ICD-10-CM

## 2012-11-29 DIAGNOSIS — K922 Gastrointestinal hemorrhage, unspecified: Secondary | ICD-10-CM

## 2012-11-29 DIAGNOSIS — F1020 Alcohol dependence, uncomplicated: Secondary | ICD-10-CM | POA: Diagnosis present

## 2012-11-29 DIAGNOSIS — I8500 Esophageal varices without bleeding: Secondary | ICD-10-CM

## 2012-11-29 DIAGNOSIS — D649 Anemia, unspecified: Secondary | ICD-10-CM

## 2012-11-29 DIAGNOSIS — I10 Essential (primary) hypertension: Secondary | ICD-10-CM | POA: Diagnosis present

## 2012-11-29 DIAGNOSIS — F411 Generalized anxiety disorder: Secondary | ICD-10-CM

## 2012-11-29 DIAGNOSIS — I8511 Secondary esophageal varices with bleeding: Secondary | ICD-10-CM | POA: Diagnosis present

## 2012-11-29 DIAGNOSIS — E119 Type 2 diabetes mellitus without complications: Secondary | ICD-10-CM | POA: Diagnosis present

## 2012-11-29 DIAGNOSIS — E871 Hypo-osmolality and hyponatremia: Secondary | ICD-10-CM | POA: Diagnosis present

## 2012-11-29 DIAGNOSIS — K7030 Alcoholic cirrhosis of liver without ascites: Principal | ICD-10-CM

## 2012-11-29 DIAGNOSIS — G4700 Insomnia, unspecified: Secondary | ICD-10-CM

## 2012-11-29 DIAGNOSIS — E861 Hypovolemia: Secondary | ICD-10-CM | POA: Diagnosis present

## 2012-11-29 DIAGNOSIS — D62 Acute posthemorrhagic anemia: Secondary | ICD-10-CM

## 2012-11-29 DIAGNOSIS — K7460 Unspecified cirrhosis of liver: Secondary | ICD-10-CM

## 2012-11-29 DIAGNOSIS — R1310 Dysphagia, unspecified: Secondary | ICD-10-CM | POA: Diagnosis present

## 2012-11-29 DIAGNOSIS — Z79899 Other long term (current) drug therapy: Secondary | ICD-10-CM

## 2012-11-29 DIAGNOSIS — M109 Gout, unspecified: Secondary | ICD-10-CM | POA: Diagnosis present

## 2012-11-29 DIAGNOSIS — D61818 Other pancytopenia: Secondary | ICD-10-CM

## 2012-11-29 HISTORY — DX: Unspecified cirrhosis of liver: K74.60

## 2012-11-29 HISTORY — PX: ESOPHAGOGASTRODUODENOSCOPY: SHX5428

## 2012-11-29 HISTORY — DX: Esophageal varices without bleeding: I85.00

## 2012-11-29 LAB — CBC WITH DIFFERENTIAL/PLATELET
Basophils Relative: 0 % (ref 0–1)
Eosinophils Absolute: 0 10*3/uL (ref 0.0–0.7)
Eosinophils Relative: 1 % (ref 0–5)
MCH: 28.2 pg (ref 26.0–34.0)
MCHC: 32.8 g/dL (ref 30.0–36.0)
MCV: 85.9 fL (ref 78.0–100.0)
Neutrophils Relative %: 58 % (ref 43–77)
Platelets: 121 10*3/uL — ABNORMAL LOW (ref 150–400)
RDW: 17.7 % — ABNORMAL HIGH (ref 11.5–15.5)

## 2012-11-29 LAB — COMPREHENSIVE METABOLIC PANEL WITH GFR
Alkaline Phosphatase: 188 U/L — ABNORMAL HIGH (ref 39–117)
BUN: 12 mg/dL (ref 6–23)
Glucose, Bld: 131 mg/dL — ABNORMAL HIGH (ref 70–99)
Sodium: 135 meq/L (ref 135–145)
Total Bilirubin: 1.1 mg/dL (ref 0.3–1.2)

## 2012-11-29 LAB — COMPREHENSIVE METABOLIC PANEL
ALT: 90 U/L — ABNORMAL HIGH (ref 0–53)
ALT: 78 U/L — ABNORMAL HIGH (ref 0–53)
AST: 99 U/L — ABNORMAL HIGH (ref 0–37)
Albumin: 3.8 g/dL (ref 3.5–5.2)
Albumin: 3.1 g/dL — ABNORMAL LOW (ref 3.5–5.2)
Alkaline Phosphatase: 190 U/L — ABNORMAL HIGH (ref 39–117)
CO2: 24 mEq/L (ref 19–32)
Calcium: 8.3 mg/dL — ABNORMAL LOW (ref 8.4–10.5)
Calcium: 8.2 mg/dL — ABNORMAL LOW (ref 8.4–10.5)
Chloride: 103 mEq/L (ref 96–112)
Creat: 0.7 mg/dL (ref 0.50–1.35)
GFR calc Af Amer: >90 mL/min (ref >90)
Glucose, Bld: 199 mg/dL — ABNORMAL HIGH (ref 70–99)
Potassium: 3.8 mEq/L (ref 3.5–5.3)
Potassium: 3.7 mEq/L (ref 3.5–5.1)
Sodium: 131 mEq/L — ABNORMAL LOW (ref 135–145)
Total Protein: 6.2 g/dL (ref 6.0–8.3)
Total Protein: 6.2 g/dL (ref 6.0–8.3)

## 2012-11-29 LAB — PREPARE RBC (CROSSMATCH)

## 2012-11-29 LAB — MRSA PCR SCREENING: MRSA by PCR: NEGATIVE

## 2012-11-29 LAB — CBC
HCT: 22.8 % — ABNORMAL LOW (ref 39.0–52.0)
MCH: 27.9 pg (ref 26.0–34.0)
MCHC: 32 g/dL (ref 30.0–36.0)
RDW: 16.9 % — ABNORMAL HIGH (ref 11.5–15.5)

## 2012-11-29 LAB — PROTIME-INR: Prothrombin Time: 16.4 seconds — ABNORMAL HIGH (ref 11.6–15.2)

## 2012-11-29 SURGERY — EGD (ESOPHAGOGASTRODUODENOSCOPY)
Anesthesia: Moderate Sedation

## 2012-11-29 MED ORDER — DIPHENHYDRAMINE HCL 50 MG/ML IJ SOLN
INTRAMUSCULAR | Status: DC | PRN
  Administered 2012-11-29: 50 mg via INTRAVENOUS

## 2012-11-29 MED ORDER — FENTANYL CITRATE 0.05 MG/ML IJ SOLN
INTRAMUSCULAR | Status: DC | PRN
  Administered 2012-11-29 (×4): 25 ug via INTRAVENOUS

## 2012-11-29 MED ORDER — ACETAMINOPHEN 650 MG RE SUPP: 650.0000 mg | Freq: Four times a day (QID) | RECTAL | Status: DC | PRN

## 2012-11-29 MED ORDER — DIPHENHYDRAMINE HCL 50 MG/ML IJ SOLN
INTRAMUSCULAR | Status: AC
  Filled 2012-11-29: qty 1

## 2012-11-29 MED ORDER — SODIUM CHLORIDE 0.9 % IV SOLN
50.0000 ug/h | INTRAVENOUS | Status: DC
  Administered 2012-11-29: 50 ug/h via INTRAVENOUS
  Filled 2012-11-29 (×2): qty 1

## 2012-11-29 MED ORDER — OMEPRAZOLE MAGNESIUM 20 MG PO TBEC: 20.0000 mg | DELAYED_RELEASE_TABLET | Freq: Every day | ORAL | Status: DC

## 2012-11-29 MED ORDER — MIDAZOLAM HCL 10 MG/2ML IJ SOLN
INTRAMUSCULAR | Status: DC | PRN
  Administered 2012-11-29 (×3): 2.5 mg via INTRAVENOUS

## 2012-11-29 MED ORDER — FENTANYL CITRATE 0.05 MG/ML IJ SOLN
INTRAMUSCULAR | Status: AC
  Filled 2012-11-29: qty 4

## 2012-11-29 MED ORDER — SODIUM CHLORIDE 0.9 % IV SOLN
1000.0000 mL | Freq: Once | INTRAVENOUS | Status: AC
  Administered 2012-11-29: 1000 mL via INTRAVENOUS

## 2012-11-29 MED ORDER — SODIUM CHLORIDE 0.9 % IJ SOLN
3.0000 mL | Freq: Two times a day (BID) | INTRAMUSCULAR | Status: DC
  Administered 2012-11-29 – 2012-11-30 (×2): 3 mL via INTRAVENOUS

## 2012-11-29 MED ORDER — FERROUS SULFATE 325 (65 FE) MG PO TABS
325.0000 mg | ORAL_TABLET | Freq: Every day | ORAL | Status: DC
Start: 2012-11-30 — End: 2012-11-30
  Administered 2012-11-30: 325 mg via ORAL
  Filled 2012-11-29 (×2): qty 1

## 2012-11-29 MED ORDER — SODIUM CHLORIDE 0.9 % IV SOLN: INTRAVENOUS | Status: DC

## 2012-11-29 MED ORDER — BUTAMBEN-TETRACAINE-BENZOCAINE 2-2-14 % EX AERO
INHALATION_SPRAY | CUTANEOUS | Status: DC | PRN
  Administered 2012-11-29: 2 via TOPICAL

## 2012-11-29 MED ORDER — PNEUMOCOCCAL VAC POLYVALENT 25 MCG/0.5ML IJ INJ
0.5000 mL | INJECTION | INTRAMUSCULAR | Status: DC
  Filled 2012-11-29 (×2): qty 0.5

## 2012-11-29 MED ORDER — MIDAZOLAM HCL 10 MG/2ML IJ SOLN
INTRAMUSCULAR | Status: AC
  Filled 2012-11-29: qty 4

## 2012-11-29 MED ORDER — ZOLPIDEM TARTRATE 10 MG PO TABS
10.0000 mg | ORAL_TABLET | Freq: Every evening | ORAL | Status: DC | PRN
  Administered 2012-11-29: 10 mg via ORAL
  Filled 2012-11-29: qty 1

## 2012-11-29 MED ORDER — MORPHINE SULFATE 2 MG/ML IJ SOLN
2.0000 mg | INTRAMUSCULAR | Status: DC | PRN
  Administered 2012-11-30 (×2): 2 mg via INTRAVENOUS
  Filled 2012-11-29 (×2): qty 1

## 2012-11-29 MED ORDER — ALLOPURINOL 300 MG PO TABS
300.0000 mg | ORAL_TABLET | Freq: Every day | ORAL | Status: DC
Start: 2012-11-30 — End: 2012-11-30
  Administered 2012-11-30: 300 mg via ORAL
  Filled 2012-11-29: qty 1

## 2012-11-29 MED ORDER — ADULT MULTIVITAMIN W/MINERALS CH
1.0000 | ORAL_TABLET | Freq: Every day | ORAL | Status: DC
  Administered 2012-11-29 – 2012-11-30 (×2): 1 via ORAL
  Filled 2012-11-29 (×2): qty 1

## 2012-11-29 MED ORDER — OCTREOTIDE LOAD VIA INFUSION
50.0000 ug | Freq: Once | INTRAVENOUS | Status: AC
  Administered 2012-11-29: 50 ug via INTRAVENOUS
  Filled 2012-11-29: qty 25

## 2012-11-29 MED ORDER — PANTOPRAZOLE SODIUM 40 MG PO TBEC
40.0000 mg | DELAYED_RELEASE_TABLET | Freq: Every day | ORAL | Status: DC
  Administered 2012-11-30: 40 mg via ORAL
  Filled 2012-11-29: qty 1

## 2012-11-29 MED ORDER — VITAMIN B-1 100 MG PO TABS
100.0000 mg | ORAL_TABLET | Freq: Every day | ORAL | Status: DC
  Administered 2012-11-29 – 2012-11-30 (×2): 100 mg via ORAL
  Filled 2012-11-29 (×2): qty 1

## 2012-11-29 MED ORDER — ACETAMINOPHEN 325 MG PO TABS
650.0000 mg | ORAL_TABLET | Freq: Four times a day (QID) | ORAL | Status: DC | PRN
  Filled 2012-11-29: qty 2

## 2012-11-29 NOTE — ED Notes (Signed)
Pt seen at [facility] last night and told hemoglobin was 6; states has been dizzy with no energy; denies black or tarry stools

## 2012-11-29 NOTE — Op Note (Signed)
[facility] [address] [place], [address]   ENDOSCOPY PROCEDURE REPORT  PATIENT: Jake [name]  MR#: [id number] BIRTHDATE: 18-Aug-1956 , 56  yrs. old GENDER: Male ENDOSCOPIST:Fender Herder, MD REFERRED BY:  [facility] PROCEDURE DATE:  11/29/2012 PROCEDURE:       upper endoscopy with variceal band ligation ASA CLASS: INDICATIONS:   hematemesis x 1 a week ago, not requiring hospitalization, but with a 5 g drop in hemoglobin in the interim, without clinical evidence of further bleeding MEDICATION:  Benadryl 50 mg, fentanyl 100 mcg IV, Versed 7.5 mg IV TOPICAL ANESTHETIC:    Cetacaine spray  DESCRIPTION OF PROCEDURE:   the patient was brought from the [facility] emergency room to the endoscopy unit and sedated with the above medications, remaining clinically stable throughout the procedure.  The Pentax adult video endoscope was passed under direct vision. The vocal cords looked normal and the esophagus was readily entered.  The proximal half of the esophagus was normal, but the distal half of the esophagus had significant varices. In the mid-esophagus, the varices were medium in size, whereas distally, they were medium-to-large. There were a number of small red dots on the varices but no specific or discrete stigmata of recent hemorrhage, and certainly no evidence of active bleeding.  The esophagus was free of reflux esophagitis, Barrett's esophagus, infection, neoplasia, or any stricture or hiatal hernia.  The stomach was entered. It contained no blood or coffee-ground material. The gastric mucosa was essentially normal, with perhaps some subtle changes of portal congestive gastropathy in the fundic region, characterized by some minimal snake skinning changes and contact hemorrhage. No erosions, ulcers, polyps, or masses were seen, and a retroflexed view of the cardia was normal, specifically without evidence of gastric varices.  The  pylorus, duodenal bulb, and second duodenum looked normal.  I elected to proceed with variceal band ligation, in view of the significant drop in hemoglobin over the past week, the documented history of hematemesis when the patient was coincidentally in the emergency room a week ago for treatment of a skin rash, and the absence of alternative sources of bleeding on today's exam. This had been discussed with the patient and he had been agreeable to undergoing banding if appropriate.  A total of 4 bands were placed on varices in the distal esophagus. Proximal to that, the varices were not quite large enough, in my opinion, to merit further banding. Therefore, the scope was removed from the patient tolerated the procedure well.     COMPLICATIONS: None  ENDOSCOPIC IMPRESSION:  1. No active bleeding or blood in the stomach at the time this exam 2. Moderately large esophageal varices, larger (by description) than those encountered by Dr. Evette Cristal 4 years ago 3. Otherwise normal upper endoscopy 4. Banding of large, distal varices performed as described above (4 bands placed)  RECOMMENDATIONS:  1. I would favor overnight observation in view of his recent bleeding, although it is not absolutely essential since the patient was stable on admission apart from his anemia, and showed no stigmata of recent hemorrhage. Variceal banding in and of itself is not a mandate for postprocedural overnight hospitalization. 2. Given the absence of any active bleeding, I feel that his octreotide can be stopped at any time 3. the patient should followup with his primary gastroenterologist, Dr. Wandalee Ferdinand, in the next several weeks to consider having further variceal banding performed, until the varices are obliterated 4. The patient needs to abstain from alcohol  5. The patient does not seem to have a lot of insight into his disease, and seemingly was not aware that he had any liver condition, even though  his CT 4 years ago showed cirrhotic changes, splenomegaly, and he had varices on endoscopy at that time. He clearly needs patient education    _______________________________ eSigned:  Bernette Redbird, MD 11/29/2012 3:32 PM    PATIENT NAME:  Jake [name], Jake [name] MR#: [id number]

## 2012-11-29 NOTE — ED Notes (Signed)
EKG given to EDP, Otter, MD. 

## 2012-11-29 NOTE — ED Provider Notes (Signed)
Medical screening examination/treatment/procedure(s) were conducted as a shared visit with non-physician practitioner(s) and myself.  I personally evaluated the patient during the encounter  Flint Melter, MD 11/29/12 2142

## 2012-11-29 NOTE — Telephone Encounter (Signed)
Checked that he did in fact go to the ER. Left message for him to return to office to see Dr. Clelia Croft in 2 weeks after he gets all his acute issues from GIB and chronic elevated LFTs  evaluated.

## 2012-11-29 NOTE — Consult Note (Signed)
Referring Provider: Dr Mancel Bale Primary Care Physician:  Rockne Coons, DO Primary Gastroenterologist:  Dr. Evette Cristal  Reason for Consultation:  GI bleeding  HPI: Jake [name] is a 57 y.o. male admitted through the emergency room today because of the marked drop in hemoglobin. The patient was seen in the emergency room a week ago because of a skin rash, and was preparing to go home from the emergency room when, out of the blue, he had significant hematemesis. That resolved spontaneously, the patient is clinically stable, his hemoglobin was 11.7, and he was released from the emergency room. He was then seen for routine initiation of care at the urgent medical care center yesterday and found to have a low hemoglobin, around 6.9, and was directed to the emergency room. He did not come in immediately but rather chose to wait until this morning to come to the ER, where his hemoglobin was found to be 6.2, with heme positive but brown, nonmalignant next stool and a normal BUN of 11. He was hemodynamically stable and in no distress but in view of the significant documented drop in hemoglobin over the past week, inpatient evaluation and care, including transfusion, was felt to be appropriate. Interestingly, other than a solitary episode of hematemesis, the patient really has no GI symptoms and no other recognizable GI bleeding. He's been on omeprazole for the past 4 years because of GERD, also takes iron. He has only sporadic usage of aspirin and nonsteroidal anti-inflammatory drugs. He does drink about a sixpack of beer once a week. Of note, endoscopy and colonoscopy were performed by Dr. Wandalee Ferdinand about 4 years ago in April of 2010, at which time the patient was found to have 1+ varices. A CT scan around that time showed a nodular liver consistent with cirrhosis, as well as splenomegaly.   Past Medical History  Diagnosis Date  . Blood transfusion   . Hypertension   . Diabetes mellitus   . Anemia   .  Anxiety   . Gout   . Esophageal varices 11/29/12    Historical  . Cirrhosis 11/29/12    historical    Past Surgical History  Procedure Laterality Date  . Colonoscopy      2011-[facility]. Normal results per patient    Prior to Admission medications   Medication Sig Start Date End Date Taking? Authorizing Provider  allopurinol (ZYLOPRIM) 300 MG tablet Take 300 mg by mouth daily.   Yes Historical Provider, MD  ALPRAZolam Prudy Feeler) 0.5 MG tablet Take 1 tablet (0.5 mg total) by mouth at bedtime as needed (sleep/anxiety). 11/28/12  Yes Thao P Le, DO  ferrous sulfate 325 (65 FE) MG tablet Take 325 mg by mouth daily with breakfast.   Yes Historical Provider, MD  losartan (COZAAR) 100 MG tablet Take 100 mg by mouth daily.   Yes Historical Provider, MD  metoprolol succinate (TOPROL-XL) 50 MG 24 hr tablet Take 50 mg by mouth daily. Take with or immediately following a meal.   Yes Historical Provider, MD  Multiple Vitamin (MULTIVITAMIN WITH MINERALS) TABS Take 1 tablet by mouth daily.   Yes Historical Provider, MD  omeprazole (PRILOSEC OTC) 20 MG tablet Take 20 mg by mouth daily.   Yes Historical Provider, MD  zolpidem (AMBIEN) 10 MG tablet Take 1 tablet (10 mg total) by mouth at bedtime as needed for sleep. 11/28/12 12/28/12 Yes Thao P Le, DO    Current Facility-Administered Medications  Medication Dose Route Frequency Provider Last Rate Last  Dose  . 0.9 %  sodium chloride infusion   Intravenous Continuous Katy Fitch Fannye Myer, MD      . octreotide (SANDOSTATIN) 2 mcg/mL in sodium chloride 0.9 % 250 mL infusion  50 mcg/hr Intravenous Continuous Flint Melter, MD 25 mL/hr at 11/29/12 1100 50 mcg/hr at 11/29/12 1100    Allergies as of 11/29/2012  . (No Known Allergies)    Family History  Problem Relation Age of Onset  . Hyperlipidemia Mother     History   Social History  . Marital Status: Married    Spouse Name: N/A    Number of Children: N/A  . Years of Education: N/A   Occupational  History  . Not on file.   Social History Main Topics  . Smoking status: Never Smoker   . Smokeless tobacco: Not on file  . Alcohol Use: 7.2 oz/week    12 Cans of beer per week     Comment: frequent  . Drug Use: No  . Sexually Active: Yes   Other Topics Concern  . Not on file   Social History Narrative  . No narrative on file    Review of Systems: See history of present illness. No weight loss, no abdominal pain or reflux symptoms, no nausea or dyspepsia, no melenic stools and Physical Exam: Vital signs in last 24 hours: Temp:  [97.6 F (36.4 C)-98.3 F (36.8 C)] 98.3 F (36.8 C) (03/20 1300) Pulse Rate:  [91-129] 98 (03/20 1300) Resp:  [15-23] 15 (03/20 1300) BP: (106-129)/(54-93) 129/93 mmHg (03/20 1300) SpO2:  [97 %-100 %] 98 % (03/20 1300) Weight:  [89.359 kg (197 lb)] 89.359 kg (197 lb) (03/19 1440)   General:   Alert,  Well-developed, well-nourished, pleasant and cooperative in NAD Head:  Normocephalic and atraumatic. Eyes:  Sclera clear, no icterus.   Conjunctiva pink. Mouth:   No ulcerations or lesions.  Oropharynx pink & moist. Lungs:  Clear throughout to auscultation.   No wheezes, crackles, or rhonchi. No evident respiratory distress. Heart:   Regular rate and rhythm; no murmurs, clicks, rubs,  or gallops. Abdomen:  Soft, nontender, nontympanitic, and nondistended. No masses, no discernible hepatosplenomegaly or ventral hernias noted. No guarding, or rebound.   Rectal: Brown heme positive stool by ER physician Msk:   Symmetrical without gross deformities. Neurologic:  Alert and coherent;  grossly normal neurologically. Skin: No spiders. Residual rash present on his face Psych:   Alert and cooperative. Normal mood and affect.  Intake/Output from previous day:   Intake/Output this shift: Total I/O In: 2325 [I.V.:2000; Blood:325] Out: 500 [Urine:500]  Lab Results:  Recent Labs  11/28/12 1611 11/29/12 0635  WBC 4.3* 3.2*  HGB 6.9* 6.2*  HCT 23.6*  18.9*  PLT  --  121*   BMET  Recent Labs  11/28/12 1608 11/29/12 0635  NA 135 131*  K 3.8 3.7  CL 103 99  CO2 24 21  GLUCOSE 131* 199*  BUN 12 11  CREATININE 0.70 0.70  CALCIUM 8.3* 8.2*   LFT  Recent Labs  11/29/12 0635  PROT 6.2  ALBUMIN 3.1*  AST 76*  ALT 78*  ALKPHOS 190*  BILITOT 0.8   PT/INR  Recent Labs  11/29/12 0635  LABPROT 16.4*  INR 1.35    Studies/Results: No results found.  Impression: 1. Recent episode of self-limited hematemesis  2. Significant drop in hemoglobin without overt bleeding  3. History of significant ethanol consumption, heavier in the past, with radiographic evidence of cirrhosis 4 years  ago and small varices present on endoscopy at that time  Plan: Endoscopic evaluation. Ashby Dawes purpose risks reviewed, with particular reference to possibility of recent bleeding if variceal treatment is needed   LOS: 0 days   Jake [name] V  11/29/2012, 2:25 PM

## 2012-11-29 NOTE — ED Provider Notes (Signed)
Jake [name] is a 57 y.o. male who is here to evaluate for anemia. He went to urgent care yesterday, "to get a primary care doctor "; was evaluated and found to be anemic. He is encouraged to come immediately to the ED, but decided to wait until this morning to come. He had run out of his Xanax and Ambien, was given a prescription for same, and slept well last night. In the week since he was here for a rash, the rash has improved, and he is having trouble sleeping. He has not had anymore nausea, vomiting, or diarrhea. He thinks his stool was somewhat darker than usual. He has had some dizziness on standing. He is anxious to get a blood transfusion, go home and return to work tomorrow.   Exam alert, cooperative. Serial vital signs improved. 0839 hours. Blood pressure 116/65 with pulse 106.  Abdomen soft and nontender Neurologic grossly nonfocal.   ED treatment: 2 units RBC, ordered  Contact GI to arrange for timing, of upper endoscopy. 10:30- case discussed with Dr. Matthias Hughs, he asked that I start octreotide keep the patient n.p.o. and he will arrange for endoscopy. He is concerned that the patient had a self-limited variceal bleed. He notes that on prior evaluation 4 years ago, the patient had grade 1 esophageal varices, and cirrhotic liver. Patient remains comfortable and stable, with reassuring vital signs, being checked frequently. Blood infusing, now    CRITICAL CARE Performed by: Mancel Bale L   Total critical care time: 45 minutes  Critical care time was exclusive of separately billable procedures and treating other patients.  Critical care was necessary to treat or prevent imminent or life-threatening deterioration.  Critical care was time spent personally by me on the following activities: development of treatment plan with patient and/or surrogate as well as nursing, discussions with consultants, evaluation of patient's response to treatment, examination of patient, obtaining  history from patient or surrogate, ordering and performing treatments and interventions, ordering and review of laboratory studies, ordering and review of radiographic studies, pulse oximetry and re-evaluation of patient's condition.   Medical screening examination/treatment/procedure(s) were conducted as a shared visit with non-physician practitioner(s) and myself.  I personally evaluated the patient during the encounter     16:40- he has returned, from endoscopy. I discussed the case with Dr. Matthias Hughs. He felt the procedure went well. The patient required banding of esophageal varices. His varices have progressed since 2014. There was no active bleeding. Dr. Matthias Hughs felt that the patient needed to be observed overnight in the step-down unit. The patient is now agreeable to staying overnight. He has received 2 units of packed red blood cells. He will be admitted to a medical service.  Flint Melter, MD 11/29/12 2142

## 2012-11-29 NOTE — Progress Notes (Signed)
Pt in room w/o any s/s of distress of pain or distress. Pt is able to make needs known. Will continue to monitor.

## 2012-11-29 NOTE — ED Notes (Signed)
WUJ:[id number]<JY> Expected date:<BR> Expected time:<BR> Means of arrival:<BR> Comments:<BR> Hold for [place]

## 2012-11-29 NOTE — Progress Notes (Signed)
[facility] History and Physical  [facility] BROADWELL JYN:[id number] DOB: 21-Oct-1955 DOA: 11/29/2012   PCP: Rockne Coons, DO   Chief Complaint: anemia, GI bleed  HPI:  57 year old male with a history of alcohol abuse and cirrhosis of the liver, hypertension, hyperglycemia, and esophageal varices presented today to the emergency department after he was informed by his urgent care physician that his hemoglobin was 6.9. The patient denies any fevers, chills, chest pain, shortness of breath, palpitations, nausea, vomiting, diarrhea, abdominal pain, hematochezia, melena. He infrequently uses NSAIDs. He denies any hemoptysis or hematuria.  As the history goes, the patient presented to the emergency department on 11/22/2012 with a rash. Prior to being discharged from the emergency department, the patient had an episode of hematemesis. Because he remained hemodynamically stable, the patient was discharged from the emergency department told to followup with his primary care physician. The patient went to the urgent care Center yesterday, 11/28/2012, where he had blood work performed. He was called today because of her low hemoglobin. In emergency department today, the patient was found to have a hemoglobin of 6.2. On 11/22/2012, his hemoglobin was 11.7. The patient has a history of esophageal varices that were banded approximately 4 years ago. Upon presentation, the patient remained hemodynamically stable with a systolic blood pressure of 120. However he was tachycardic in the 120s. Dr. Marjorie Smolder from the GI service was consulted to see the patient. The patient was taken to endoscopy where he had banding of his esophageal varices x4 today. There was no active bleeding noted on endoscopy today. The patient has remained hemodynamically stable throughout the day. He received 2 units PRBCs in the emergency department. [facility] has been asked to admit the patient overnight for observation due to the severity of his  medical condition. Of note, the patient also has pancytopenia with WBC 3.2, platelets 121,000. He was also noted to have mild hyponatremia with a sodium of 131.   Assessment/Plan: Acute upper GI bleed -Secondary to esophageal varices -Status post variceal banding x4 by Dr. 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 -The patient last drank alcohol on 11/22/2012 -Long discussion with the patient and his wife regarding cessation -He appears to have poor insight into his medical condition Hypertension -Patient's blood pressure has been soft in the emergency department although he is stable -Hold losartan and metoprolol tartrate at this time and monitor Pancytopenia -Likely due to the patient's liver cirrhosis -The patient was previously noted to have splenomegaly Hyperglycemia/impaired glucose tolerance -Hemoglobin A1c 5.7 on 11/28/2012 -The patient is not on any oral hypoglycemic agents at this time  Alcohol abuse  -patient's last drink was 11/22/2012 - start patient on thiamine Mild hyponatremia -likely hypovolemia -recheck bmp in am -pt received 2 units PRBC  -TSH       Past Medical History  Diagnosis Date  . Blood transfusion   . Hypertension   . Diabetes mellitus   . Anemia   . Anxiety   . Gout   . Esophageal varices 11/29/12    Historical  . Cirrhosis 11/29/12    historical   Past Surgical History  Procedure Laterality Date  . Colonoscopy      2011-[facility]. Normal  results per patient  . Upper gi endoscopy     Social History:  reports that he has never smoked. He has never used smokeless tobacco. He reports that he drinks about 7.2 ounces of  alcohol per week. He reports that he does not use illicit drugs.   Family History  Problem Relation Age of Onset  . Hyperlipidemia Mother      No Known Allergies    Prior to Admission medications   Medication Sig Start Date End Date Taking? Authorizing Provider  allopurinol (ZYLOPRIM) 300 MG tablet Take 300 mg by mouth daily.   Yes Historical Provider, MD  ALPRAZolam Prudy Feeler) 0.5 MG tablet Take 1 tablet (0.5 mg total) by mouth at bedtime as needed (sleep/anxiety). 11/28/12  Yes Thao P Le, DO  ferrous sulfate 325 (65 FE) MG tablet Take 325 mg by mouth daily with breakfast.   Yes Historical Provider, MD  losartan (COZAAR) 100 MG tablet Take 100 mg by mouth daily.   Yes Historical Provider, MD  metoprolol succinate (TOPROL-XL) 50 MG 24 hr tablet Take 50 mg by mouth daily. Take with or immediately following a meal.   Yes Historical Provider, MD  Multiple Vitamin (MULTIVITAMIN WITH MINERALS) TABS Take 1 tablet by mouth daily.   Yes Historical Provider, MD  omeprazole (PRILOSEC OTC) 20 MG tablet Take 20 mg by mouth daily.   Yes Historical Provider, MD  zolpidem (AMBIEN) 10 MG tablet Take 1 tablet (10 mg total) by mouth at bedtime as needed for sleep. 11/28/12 12/28/12 Yes Thao P Le, DO    Review of Systems:  Constitutional:  No weight loss, night sweats, Fevers, chills, fatigue.  Head&Eyes: No headache.  No vision loss.  No eye pain or scotoma ENT:  No Difficulty swallowing,Tooth/dental problems,Sore throat,  No ear ache, post nasal drip,  Cardio-vascular:  No chest pain, Orthopnea, PND, swelling in lower extremities,  dizziness, palpitations  GI:  No  abdominal pain, nausea, vomiting, diarrhea, loss of appetite, hematochezia, melena, heartburn, indigestion, Resp:  No shortness of breath with exertion or at rest. No cough. No coughing up of blood .No wheezing.No chest wall deformity  Skin:  no rash or lesions.  GU:  no dysuria, change in color of urine, no urgency or frequency. No  flank pain.  Musculoskeletal:  No joint pain or swelling. No decreased range of motion. No back pain.  Psych:  No change in mood or affect. No depression or anxiety. Neurologic: No headache, no dysesthesia, no focal weakness, no vision loss. No syncope  Physical Exam: Filed Vitals:   11/29/12 1540 11/29/12 1550 11/29/12 1700 11/29/12 1715  BP: 116/67 108/61  132/82  Pulse:   92 96  Temp:      TempSrc:      Resp: 15 15  20   SpO2: 99% 100% 97% 100%   General:  A&O x 3, NAD, nontoxic, pleasant/cooperative Head/Eye: No conjunctival hemorrhage, no icterus, [place]/AT, No nystagmus ENT:  No icterus,  No thrush, good dentition, no pharyngeal exudate Neck:  No masses, no lymphadenpathy, no bruits CV:  RRR, no rub, no gallop, no S3 Lung:  CTAB, good air movement, no wheeze, no rhonchi Abdomen: soft/NT, +BS, nondistended, no peritoneal signs Ext: No cyanosis, No rashes, No petechiae, No lymphangitis, No edema   Labs on Admission:  Basic Metabolic Panel:  Recent Labs Lab 11/28/12 1608 11/29/12 0635  NA 135 131*  K 3.8 3.7  CL 103 99  CO2 24 21  GLUCOSE 131* 199*  BUN 12  11  CREATININE 0.70 0.70  CALCIUM 8.3* 8.2*   Liver Function Tests:  Recent Labs Lab 11/28/12 1608 11/29/12 0635  AST 99* 76*  ALT 90* 78*  ALKPHOS 188* 190*  BILITOT 1.1 0.8  PROT 6.2 6.2  ALBUMIN 3.8 3.1*   No results found for this basename: LIPASE, AMYLASE,  in the last 168 hours No results found for this basename: AMMONIA,  in the last 168 hours CBC:  Recent Labs Lab 11/28/12 1611 11/29/12 0635  WBC 4.3* 3.2*  NEUTROABS  --  1.9  HGB 6.9* 6.2*  HCT 23.6* 18.9*  MCV 86.7 85.9  PLT  --  121*   Cardiac Enzymes: No results found for this basename: CKTOTAL, CKMB, CKMBINDEX, TROPONINI,  in the last 168 hours BNP: No components found with this basename: POCBNP,  CBG: No results found for this basename: GLUCAP,  in the last 168 hours  Radiological Exams on Admission: No results  found.  EKG: Independently reviewed. Sinus, no ST-T changes    Time spent:70 minutes Code Status:   full Family Communication:   wife at bedside   Kalasia Crafton, DO  [facility] Pager [phone]  If 7PM-7AM, please contact night-coverage [email or web address] Password [facility] 11/29/2012, 6:14 PM

## 2012-11-29 NOTE — ED Provider Notes (Signed)
History     CSN: [id number]  Arrival date & time 11/29/12  0613   First MD Initiated Contact with Patient 11/29/12 [phone]      Chief Complaint  Patient presents with  . Anemia    (Consider location/radiation/quality/duration/timing/severity/associated sxs/prior treatment) HPI Comments: Patient is a 57 year old man with a history of hypertension and diabetes who presents from home after being evaluated at [facility] urgent care last night, which serves as the patient's primary care facility, and found to have a hemoglobin level of 6. Patient states he has had symptoms including generalized weakness, intermittent shortness of breath, and fatigue over the last 2-3 weeks which he attributed to lack of sleep. Patient denies any aggravating or alleviating factors the symptoms. Patient denies headache, loss of consciousness, chest pain, abdominal pain, nausea, vomiting, diarrhea, melena, and hematochezia. Patient has a history of a blood transfusion in the past. Patient cannot recall exactly when this was but states it was within the last 5 years. Patient states he had an endoscopy and colonoscopy done to try and determine the source of the bleeding which were grossly unremarkable though he states "I think they found something with the esophagus". States these procedures were completed at [facility]. Patient has a history of excessive alcohol use up to 3 years ago when he would drink a sixpack 5-6 days per week. Patient states he now only drinks 1 six pack twice a week.  Patient is a 57 y.o. male presenting with anemia. The history is provided by the patient. No language interpreter was used.  Anemia Associated symptoms include fatigue and weakness. Pertinent negatives include no abdominal pain, chest pain, chills, fever, myalgias, nausea or vomiting.    Past Medical History  Diagnosis Date  . Blood transfusion   . Hypertension   . Diabetes mellitus   . Anemia   . Anxiety   . Gout     Past Surgical  History  Procedure Laterality Date  . Colonoscopy      2011-[facility]. Normal results per patient    Family History  Problem Relation Age of Onset  . Hyperlipidemia Mother     History  Substance Use Topics  . Smoking status: Never Smoker   . Smokeless tobacco: Not on file  . Alcohol Use: 7.2 oz/week    12 Cans of beer per week     Comment: frequent      Review of Systems  Constitutional: Positive for fatigue. Negative for fever and chills.  Eyes: Negative for visual disturbance.  Respiratory: Positive for shortness of breath. Negative for wheezing.   Cardiovascular: Negative for chest pain.  Gastrointestinal: Negative for nausea, vomiting, abdominal pain, diarrhea, blood in stool and abdominal distention.  Genitourinary: Negative for dysuria and hematuria.  Musculoskeletal: Negative for myalgias.  Skin: Negative for color change.  Neurological: Positive for weakness. Negative for syncope.  Psychiatric/Behavioral: Negative for confusion.  All other systems reviewed and are negative.    Allergies  Review of patient's allergies indicates no known allergies.  Home Medications   Current Outpatient Rx  Name  Route  Sig  Dispense  Refill  . allopurinol (ZYLOPRIM) 300 MG tablet   Oral   Take 300 mg by mouth daily.         Marland Kitchen ALPRAZolam (XANAX) 0.5 MG tablet   Oral   Take 1 tablet (0.5 mg total) by mouth at bedtime as needed (sleep/anxiety).   30 tablet   1   . ferrous sulfate 325 (65 FE) MG tablet  Oral   Take 325 mg by mouth daily with breakfast.         . losartan (COZAAR) 100 MG tablet   Oral   Take 100 mg by mouth daily.         . metoprolol succinate (TOPROL-XL) 50 MG 24 hr tablet   Oral   Take 50 mg by mouth daily. Take with or immediately following a meal.         . Multiple Vitamin (MULTIVITAMIN WITH MINERALS) TABS   Oral   Take 1 tablet by mouth daily.         Marland Kitchen omeprazole (PRILOSEC OTC) 20 MG tablet   Oral   Take 20 mg by mouth  daily.         Marland Kitchen zolpidem (AMBIEN) 10 MG tablet   Oral   Take 1 tablet (10 mg total) by mouth at bedtime as needed for sleep.   30 tablet   5     BP 119/68  Pulse 103  Temp(Src) 97.9 F (36.6 C) (Oral)  Resp 17  SpO2 98%  Physical Exam  Nursing note and vitals reviewed. Constitutional: He appears well-developed and well-nourished. No distress.  HENT:  Head: Normocephalic and atraumatic.  Mouth/Throat: Oropharynx is clear and moist. No oropharyngeal exudate.  Symmetric rise of the uvula with pronation. No oropharyngeal erythema or edema appreciated.  Eyes: Conjunctivae and EOM are normal. Pupils are equal, round, and reactive to light. No scleral icterus.  Neck: Normal range of motion. Neck supple.  Cardiovascular: Regular rhythm, normal heart sounds and intact distal pulses.   +tachycardic. Distal radial, posterior tibial, and dorsalis pedis pulses 2+ bilaterally the  Pulmonary/Chest: Effort normal and breath sounds normal. No respiratory distress. He has no wheezes. He has no rales.  Abdominal: Soft. He exhibits no distension. There is no tenderness. There is no rebound and no guarding.  Bowel sounds hyperactive  Genitourinary: Rectum normal. Rectal exam shows no external hemorrhoid, no internal hemorrhoid, no fissure, no mass, no tenderness and anal tone normal.  Soft brown stool appreciated on rectal exam  Musculoskeletal: Normal range of motion.  Lymphadenopathy:    He has no cervical adenopathy.  Skin: Skin is warm and dry. He is not diaphoretic. No erythema. There is pallor.  Psychiatric: He has a normal mood and affect. His behavior is normal.    ED Course  Procedures (including critical care time)  Labs Reviewed  CBC WITH DIFFERENTIAL - Abnormal; Notable for the following:    WBC 3.2 (*)    RBC 2.20 (*)    Hemoglobin 6.2 (*)    HCT 18.9 (*)    RDW 17.7 (*)    Platelets 121 (*)    Monocytes Relative 13 (*)    All other components within normal limits   COMPREHENSIVE METABOLIC PANEL - Abnormal; Notable for the following:    Sodium 131 (*)    Glucose, Bld 199 (*)    Calcium 8.2 (*)    Albumin 3.1 (*)    AST 76 (*)    ALT 78 (*)    Alkaline Phosphatase 190 (*)    All other components within normal limits  PROTIME-INR - Abnormal; Notable for the following:    Prothrombin Time 16.4 (*)    All other components within normal limits  OCCULT BLOOD, POC DEVICE - Abnormal; Notable for the following:    Fecal Occult Bld POSITIVE (*)    All other components within normal limits  TYPE AND SCREEN  PREPARE  RBC (CROSSMATCH)   No results found.   Date: 11/29/2012  Rate: 115  Rhythm: sinus tachycardia  QRS Axis: normal  Intervals: normal  ST/T Wave abnormalities: nonspecific ST changes  Conduction Disutrbances:none  Narrative Interpretation: sinus tachycardia with nonspecific ST changes  Old EKG Reviewed: none available I have personally reviewed and interpreted this EKG.   1. Anemia     MDM  Patient presents from home after being seen in urgent care last night with a hemoglobin of 6.9 with general symptoms of weakness and fatigue x 2 weeks. Patient's hemoglobin on evaluation in the ED today 6.2 and hemoccult positive; LFTs elevated but consistent with prior workups. Patient's heart rate has dropped from 129 to 106 with 2 L of IV fluid hydration. 2 units of RBCs ordered.  Patient care assumed by Dr. Effie Shy at 10:00 who will consult GI regarding the patient having an endoscopy today to try and determine the cause of his anemia. Disposition to be determined.       Antony Madura, PA-C 11/29/12 1835

## 2012-11-29 NOTE — Progress Notes (Signed)
Please see dictated procedure report for findings and recommendations.  Florencia Reasons, M.D. [phone]

## 2012-11-30 ENCOUNTER — Encounter (HOSPITAL_COMMUNITY): Payer: Self-pay | Admitting: Gastroenterology

## 2012-11-30 LAB — COMPREHENSIVE METABOLIC PANEL
Albumin: 3 g/dL — ABNORMAL LOW (ref 3.5–5.2)
Alkaline Phosphatase: 165 U/L — ABNORMAL HIGH (ref 39–117)
BUN: 7 mg/dL (ref 6–23)
Calcium: 8.2 mg/dL — ABNORMAL LOW (ref 8.4–10.5)
Potassium: 4.1 mEq/L (ref 3.5–5.1)
Total Protein: 5.5 g/dL — ABNORMAL LOW (ref 6.0–8.3)

## 2012-11-30 LAB — PREPARE RBC (CROSSMATCH)

## 2012-11-30 LAB — TSH: TSH: 0.731 u[IU]/mL (ref 0.350–4.500)

## 2012-11-30 MED ORDER — METOPROLOL SUCCINATE ER 25 MG PO TB24
25.0000 mg | ORAL_TABLET | Freq: Every day | ORAL | Status: DC
  Administered 2012-11-30: 25 mg via ORAL
  Filled 2012-11-30 (×2): qty 1

## 2012-11-30 MED ORDER — THIAMINE HCL 100 MG PO TABS: 100.0000 mg | ORAL_TABLET | Freq: Every day | ORAL | Status: DC

## 2012-11-30 MED ORDER — METOPROLOL SUCCINATE ER 25 MG PO TB24: 25.0000 mg | ORAL_TABLET | Freq: Every day | ORAL | Status: DC

## 2012-11-30 NOTE — Progress Notes (Signed)
GASTROENTEROLOGY PROGRESS NOTE  Problem:   1. 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 Portal hypertension 3. Recent hematemesis 4. Posthemorrhagic anemia, now status post transfusion with partial response 5. Odynophagia/chest pain since banding procedure yesterday  Subjective: The patient has a tightness or a lump sensation and goes down his esophagus and back up when he swallows, since his variceal banding yesterday.  No bowel movements or other evidence of bleeding.   Objective: Suboptimal rise in hemoglobin following transfusion. He went from 6.2-7.3 after 2 units of packed cells. No evident bleeding. BUN is 7.  Assessment: Lengthy discussion with patient for approximately one half hour regarding his endoscopic findings, his current liver status, importance of alcohol abstinence, and likely prognosis which is fairly good if he stops drinking. Also, need for further variceal ligation (to obliteration) discussed.  Plan: 1. Discussed with patient's attending physician, Dr. Onalee Hua Tat. 2. Recommend transfusing one additional unit of packed cells, to bring his hemoglobin closer to 8, since he is in a very physical occupation as a landscaper 3. Okay for discharge following transfusion, given the absence of bleeding and his overall clinical stability 4. His chest discomfort with swallowing is fairly typical following banding, and I do not feel requires further evaluation or intervention. It is not that bad, he is able to tolerate it, and just wanted reassurance that it will eventually go away, which I expect it will within the next one to 2 weeks. 5. I have made the patient a followup appointment with his previous gastroenterologist, Dr. Wandalee Ferdinand, for March 28 at 3:30 PM 6. Please let me know if I can be of further assistance with this patient very  Florencia Reasons, M.D. 11/30/2012 11:06 AM

## 2012-11-30 NOTE — Discharge Summary (Signed)
Physician Discharge Summary  Jake [name]:[id number] DOB: Jul 08, 1956 DOA: 11/29/2012  PCP: Rockne Coons, DO  Admit date: 11/29/2012 Discharge date: 11/30/2012  Recommendations for Outpatient Follow-up:  1. Pt will need to follow up with PCP in 2 weeks post discharge 2. Please obtain BMP to evaluate electrolytes and kidney function 3. Please also check CBC to evaluate Hg and Hct levels 4. Follow up with Dr. Luan Moore, 12/07/12@330pm   Discharge Diagnoses:  Active Problems:   Acute blood loss anemia   Liver cirrhosis, alcoholic   Pancytopenia Acute upper GI bleed  -Secondary to esophageal varices  -Status post variceal banding x4 by Dr. Marjorie Smolder  -Repeat H&H after transfusion--7.3 -Admitted to stepdown and observed -No need for octreotide at this point as the patient is not having active bleeding noted on EGD  -Place patient on bland soft diet at this time status post esophageal variceal banding  Acute blood loss anemia  -Status post transfusion 2 units PRBCs  -Continue home iron supplementation  -Discussed case with Dr. Marjorie Smolder on day of discharge--> he recommended transfusion of one additional unit of PRBCs prior to discharge -The patient remained hemodynamically stable and clinically stable and was subsequently discharged after 1 additional unit PRBC on 11/30/12 Alcohol at liver cirrhosis  -The patient last drank alcohol on 11/22/2012  -Long discussion with the patient and his wife regarding cessation  -He appears to have poor insight into his medical condition  Hypertension  -Patient's blood pressure has been soft in the emergency department although he is stable  -Hold losartan until he follows up with his primary care physician -The patient was restarted on metoprolol succinate at a lower dose, 25 mg -He will need to followup with his primary care physician regarding titration of his antihypertensive medications. Pancytopenia  -Likely due to the patient's liver cirrhosis   -The patient was previously noted to have splenomegaly  Hyperglycemia/impaired glucose tolerance  -Hemoglobin A1c 5.7 on 11/28/2012  -The patient is not on any oral hypoglycemic agents at this time  Alcohol abuse  -patient's last drink was 11/22/2012  - start patient on thiamine  Mild hyponatremia  -likely hypovolemia  -recheck bmp-->improved with transfusion and volume  -pt received 3 units PRBCs tpta; -TSH--0.731   Discharge Condition: stable  Disposition:  Follow-up Information   Follow up with [name], MD. (December 07, 2012 @330pm )    Contact information:   [address] [place], [address] [place] [address] [phone]     home  Diet:bland, soft diet  Wt Readings from Last 3 Encounters:  11/30/12 90 kg (198 lb 6.6 oz)  11/30/12 90 kg (198 lb 6.6 oz)  11/28/12 89.359 kg (197 lb)    History of present illness:  57 year old male with a history of alcohol abuse and cirrhosis of the liver, hypertension, hyperglycemia, and esophageal varices presented today to the emergency department after he was informed by his urgent care physician that his hemoglobin was 6.9. The patient denies any fevers, chills, chest pain, shortness of breath, palpitations, nausea, vomiting, diarrhea, abdominal pain, hematochezia, melena. He infrequently uses NSAIDs. He denies any hemoptysis or hematuria.  As the history goes, the patient presented to the emergency department on 11/22/2012 with a rash. Prior to being discharged from the emergency department, the patient had an episode of hematemesis. Because he remained hemodynamically stable, the patient was discharged from the emergency department told to followup with his primary care physician. The patient went to the urgent care Center yesterday, 11/28/2012, where he  had blood work performed. He was called today because of her low hemoglobin. In emergency department today, the patient was found to have a hemoglobin of 6.2. On 11/22/2012, his  hemoglobin was 11.7. The patient has a history of esophageal varices that were banded approximately 4 years ago. Upon presentation, the patient remained hemodynamically stable with a systolic blood pressure of 120. However he was tachycardic in the 120s. Dr. Marjorie Smolder from the GI service was consulted to see the patient. The patient was taken to endoscopy where he had banding of his esophageal varices x4 today. There was no active bleeding noted on endoscopy today. The patient has remained hemodynamically stable throughout the day. He received 2 units PRBCs in the emergency department. [facility] has been asked to admit the patient overnight for observation due to the severity of his medical condition. Of note, the patient also has pancytopenia with WBC 3.2, platelets 121,000. He was also noted to have mild hyponatremia with a sodium of 131.     Consultants: GI, Dr. Marjorie Smolder  Discharge Exam: Filed Vitals:   11/30/12 0900  BP:   Pulse: 107  Temp:   Resp: 19   Filed Vitals:   11/30/12 0400 11/30/12 0800 11/30/12 0845 11/30/12 0900  BP: 115/80 134/80    Pulse: 92 98 106 107  Temp: 98.5 F (36.9 C) 98 F (36.7 C)    TempSrc: Oral Oral    Resp: 14 13 17 19   Height:      Weight:      SpO2: 95% 99% 97% 96%   General: A&O x 3, NAD, pleasant, cooperative Cardiovascular: RRR, no rub, no gallop, no S3 Respiratory: CTAB, no wheeze, no rhonchi Abdomen:soft, nontender, nondistended, positive bowel sounds Extremities: No edema, No lymphangitis, no petechiae  Discharge Instructions      Discharge Orders   Future Orders Complete By Expires     Diet - low sodium heart healthy  As directed     Discharge instructions  As directed     Comments:      Stop taking metoprolol succinate 50mg  Start taking metoprolol succinate 25mg  once daily Stop taking losartan (Cozaar) until you follow up with your primary care physician    Increase activity slowly  As directed         Medication List    STOP taking  these medications       losartan 100 MG tablet  Commonly known as:  COZAAR      TAKE these medications       allopurinol 300 MG tablet  Commonly known as:  ZYLOPRIM  Take 300 mg by mouth daily.     ALPRAZolam 0.5 MG tablet  Commonly known as:  XANAX  Take 1 tablet (0.5 mg total) by mouth at bedtime as needed (sleep/anxiety).     ferrous sulfate 325 (65 FE) MG tablet  Take 325 mg by mouth daily with breakfast.     metoprolol succinate 25 MG 24 hr tablet  Commonly known as:  TOPROL-XL  Take 1 tablet (25 mg total) by mouth daily.     multivitamin with minerals Tabs  Take 1 tablet by mouth daily.     omeprazole 20 MG tablet  Commonly known as:  PRILOSEC OTC  Take 20 mg by mouth daily.     thiamine 100 MG tablet  Take 1 tablet (100 mg total) by mouth daily.     zolpidem 10 MG tablet  Commonly known as:  AMBIEN  Take 1 tablet (10 mg  total) by mouth at bedtime as needed for sleep.         The results of significant diagnostics from this hospitalization (including imaging, microbiology, ancillary and laboratory) are listed below for reference.    Significant Diagnostic Studies: No results found.   Microbiology: Recent Results (from the past 240 hour(s))  MRSA PCR SCREENING     Status: None   Collection Time    11/29/12  7:20 PM      Result Value Range Status   MRSA by PCR NEGATIVE  NEGATIVE Final   Comment:            The GeneXpert MRSA Assay (FDA     approved for NASAL specimens     only), is one component of a     comprehensive MRSA colonization     surveillance program. It is not     intended to diagnose MRSA     infection nor to guide or     monitor treatment for     MRSA infections.     Labs: Basic Metabolic Panel:  Recent Labs Lab 11/28/12 1608 11/29/12 0635 11/30/12 0338  NA 135 131* 135  K 3.8 3.7 4.1  CL 103 99 103  CO2 24 21 25   GLUCOSE 131* 199* 123*  BUN 12 11 7   CREATININE 0.70 0.70 0.65  CALCIUM 8.3* 8.2* 8.2*   Liver Function  Tests:  Recent Labs Lab 11/28/12 1608 11/29/12 0635 11/30/12 0338  AST 99* 76* 87*  ALT 90* 78* 78*  ALKPHOS 188* 190* 165*  BILITOT 1.1 0.8 1.7*  PROT 6.2 6.2 5.5*  ALBUMIN 3.8 3.1* 3.0*   No results found for this basename: LIPASE, AMYLASE,  in the last 168 hours No results found for this basename: AMMONIA,  in the last 168 hours CBC:  Recent Labs Lab 11/28/12 1611 11/29/12 0635 11/29/12 2008  WBC 4.3* 3.2* 2.5*  NEUTROABS  --  1.9  --   HGB 6.9* 6.2* 7.3*  HCT 23.6* 18.9* 22.8*  MCV 86.7 85.9 87.0  PLT  --  121* 106*   Cardiac Enzymes: No results found for this basename: CKTOTAL, CKMB, CKMBINDEX, TROPONINI,  in the last 168 hours BNP: No components found with this basename: POCBNP,  CBG: No results found for this basename: GLUCAP,  in the last 168 hours  Time coordinating discharge:  Greater than 30 minutes  Signed:  Nadine Ryle, DO [facility] Pager: [phone] 11/30/2012, 12:57 PM

## 2012-11-30 NOTE — Progress Notes (Signed)
Pt refused tylenol for mild pain in esophagus due to pmh of cirrhosis.  Tylenol 650 mg disposed of in sharps.  RN will follow up w/ MD.

## 2012-11-30 NOTE — H&P (Signed)
[facility] History and Physical  Jake [name] ZOX:[id number] DOB: 1956/05/29 DOA: 11/29/2012   PCP: Rockne Coons, DO   Chief Complaint: anemia, GI bleed  HPI:  57 year old male with a history of alcohol abuse and cirrhosis of the liver, hypertension, hyperglycemia, and esophageal varices presented today to the emergency department after he was informed by his urgent care physician that his hemoglobin was 6.9. The patient denies any fevers, chills, chest pain, shortness of breath, palpitations, nausea, vomiting, diarrhea, abdominal pain, hematochezia, melena. He infrequently uses NSAIDs. He denies any hemoptysis or hematuria.  As the history goes, the patient presented to the emergency department on 11/22/2012 with a rash. Prior to being discharged from the emergency department, the patient had an episode of hematemesis. Because he remained hemodynamically stable, the patient was discharged from the emergency department told to followup with his primary care physician. The patient went to the urgent care Center yesterday, 11/28/2012, where he had blood work performed. He was called today because of her low hemoglobin. In emergency department today, the patient was found to have a hemoglobin of 6.2. On 11/22/2012, his hemoglobin was 11.7. The patient has a history of esophageal varices that were banded approximately 4 years ago. Upon presentation, the patient remained hemodynamically stable with a systolic blood pressure of 120. However he was tachycardic in the 120s. Dr. Marjorie Smolder from the GI service was consulted to see the patient. The patient was taken to endoscopy where he had banding of his esophageal varices x4 today. There was no active bleeding noted on endoscopy today. The patient has remained hemodynamically stable throughout the day. He received 2 units PRBCs in the emergency department. [facility] has been asked to admit the patient overnight for observation due to the severity of his  medical condition. Of note, the patient also has pancytopenia with WBC 3.2, platelets 121,000. He was also noted to have mild hyponatremia with a sodium of 131.   Assessment/Plan: Acute upper GI bleed -Secondary to esophageal varices -Status post variceal banding x4 by Dr. 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 -The patient last drank alcohol on 11/22/2012 -Long discussion with the patient and his wife regarding cessation -He appears to have poor insight into his medical condition Hypertension -Patient's blood pressure has been soft in the emergency department although he is stable -Hold losartan and metoprolol tartrate at this time and monitor Pancytopenia -Likely due to the patient's liver cirrhosis -The patient was previously noted to have splenomegaly Hyperglycemia/impaired glucose tolerance -Hemoglobin A1c 5.7 on 11/28/2012 -The patient is not on any oral hypoglycemic agents at this time  Alcohol abuse  -patient's last drink was 11/22/2012 - start patient on thiamine Mild hyponatremia -likely hypovolemia -recheck bmp in am -pt received 2 units PRBC  -TSH       Past Medical History  Diagnosis Date  . Blood transfusion   . Hypertension   . Diabetes mellitus   . Anemia   . Anxiety   . Gout   . Esophageal varices 11/29/12    Historical  . Cirrhosis 11/29/12    historical   Past Surgical History  Procedure Laterality Date  . Colonoscopy      2011-[facility]. Normal  results per patient  . Upper gi endoscopy    . Esophagogastroduodenoscopy N/A 11/29/2012    Procedure: ESOPHAGOGASTRODUODENOSCOPY (EGD);  Surgeon: Florencia Reasons, MD;  Location:  [facility];  Service: Endoscopy;  Laterality: N/A;   Social History:  reports that he has never smoked. He has never used smokeless tobacco. He reports that he drinks about 7.2 ounces of alcohol per week. He reports that he does not use illicit drugs.   Family History  Problem Relation Age of Onset  . Hyperlipidemia Mother      No Known Allergies    Prior to Admission medications   Medication Sig Start Date End Date Taking? Authorizing Provider  allopurinol (ZYLOPRIM) 300 MG tablet Take 300 mg by mouth daily.   Yes Historical Provider, MD  ALPRAZolam Prudy Feeler) 0.5 MG tablet Take 1 tablet (0.5 mg total) by mouth at bedtime as needed (sleep/anxiety). 11/28/12  Yes Thao P Le, DO  ferrous sulfate 325 (65 FE) MG tablet Take 325 mg by mouth daily with breakfast.   Yes Historical Provider, MD  losartan (COZAAR) 100 MG tablet Take 100 mg by mouth daily.   Yes Historical Provider, MD  metoprolol succinate (TOPROL-XL) 50 MG 24 hr tablet Take 50 mg by mouth daily. Take with or immediately following a meal.   Yes Historical Provider, MD  Multiple Vitamin (MULTIVITAMIN WITH MINERALS) TABS Take 1 tablet by mouth daily.   Yes Historical Provider, MD  omeprazole (PRILOSEC OTC) 20 MG tablet Take 20 mg by mouth daily.   Yes Historical Provider, MD  zolpidem (AMBIEN) 10 MG tablet Take 1 tablet (10 mg total) by mouth at bedtime as needed for sleep. 11/28/12 12/28/12 Yes Thao P Le, DO    Review of Systems:  Constitutional:  No weight loss, night sweats, Fevers, chills, fatigue.  Head&Eyes: No headache.  No vision loss.  No eye pain or scotoma ENT:  No Difficulty swallowing,Tooth/dental problems,Sore throat,  No ear ache, post nasal drip,  Cardio-vascular:  No chest pain, Orthopnea, PND, swelling in lower extremities,  dizziness, palpitations  GI:  No  abdominal pain, nausea, vomiting, diarrhea, loss of appetite, hematochezia, melena, heartburn, indigestion, Resp:  No shortness of breath with exertion  or at rest. No cough. No coughing up of blood .No wheezing.No chest wall deformity  Skin:  no rash or lesions.  GU:  no dysuria, change in color of urine, no urgency or frequency. No flank pain.  Musculoskeletal:  No joint pain or swelling. No decreased range of motion. No back pain.  Psych:  No change in mood or affect. No depression or anxiety. Neurologic: No headache, no dysesthesia, no focal weakness, no vision loss. No syncope  Physical Exam: Filed Vitals:   11/30/12 0400 11/30/12 0800 11/30/12 0845 11/30/12 0900  BP: 115/80 134/80    Pulse: 92 98 106 107  Temp: 98.5 F (36.9 C) 98 F (36.7 C)    TempSrc: Oral Oral    Resp: 14 13 17 19   Height:      Weight:      SpO2: 95% 99% 97% 96%   General:  A&O x 3, NAD, nontoxic, pleasant/cooperative Head/Eye: No conjunctival hemorrhage, no icterus, [place]/AT, No nystagmus ENT:  No icterus,  No thrush, good dentition, no pharyngeal exudate Neck:  No masses, no lymphadenpathy, no bruits CV:  RRR, no rub, no gallop, no S3 Lung:  CTAB, good air movement, no wheeze, no rhonchi Abdomen: soft/NT, +BS, nondistended, no peritoneal signs Ext: No cyanosis, No rashes, No  petechiae, No lymphangitis, No edema   Labs on Admission:  Basic Metabolic Panel:  Recent Labs Lab 11/28/12 1608 11/29/12 0635 11/30/12 0338  NA 135 131* 135  K 3.8 3.7 4.1  CL 103 99 103  CO2 24 21 25   GLUCOSE 131* 199* 123*  BUN 12 11 7   CREATININE 0.70 0.70 0.65  CALCIUM 8.3* 8.2* 8.2*   Liver Function Tests:  Recent Labs Lab 11/28/12 1608 11/29/12 0635 11/30/12 0338  AST 99* 76* 87*  ALT 90* 78* 78*  ALKPHOS 188* 190* 165*  BILITOT 1.1 0.8 1.7*  PROT 6.2 6.2 5.5*  ALBUMIN 3.8 3.1* 3.0*   No results found for this basename: LIPASE, AMYLASE,  in the last 168 hours No results found for this basename: AMMONIA,  in the last 168 hours CBC:  Recent Labs Lab 11/28/12 1611 11/29/12 0635 11/29/12 2008  WBC 4.3* 3.2* 2.5*  NEUTROABS  --  1.9  --    HGB 6.9* 6.2* 7.3*  HCT 23.6* 18.9* 22.8*  MCV 86.7 85.9 87.0  PLT  --  121* 106*   Cardiac Enzymes: No results found for this basename: CKTOTAL, CKMB, CKMBINDEX, TROPONINI,  in the last 168 hours BNP: No components found with this basename: POCBNP,  CBG: No results found for this basename: GLUCAP,  in the last 168 hours  Radiological Exams on Admission: No results found.  EKG: Independently reviewed. Sinus, no ST-T changes    Time spent:70 minutes Code Status:   full Family Communication:   wife at bedside   Jazira Maloney, DO  [facility] Pager [phone]  If 7PM-7AM, please contact night-coverage [email or web address] Password [facility] 11/30/2012, 11:16 AM

## 2012-12-01 LAB — TYPE AND SCREEN
ABO/RH(D): A NEG
Antibody Screen: NEGATIVE
Unit division: 0
Unit division: 0

## 2012-12-01 MED ORDER — LORAZEPAM 2 MG/ML IJ SOLN
INTRAMUSCULAR | Status: AC
  Filled 2012-12-01: qty 2

## 2012-12-11 ENCOUNTER — Other Ambulatory Visit: Payer: Self-pay | Admitting: Radiology

## 2013-01-07 ENCOUNTER — Telehealth: Payer: Self-pay

## 2013-01-07 NOTE — Telephone Encounter (Signed)
Pt brought in medication bottles that he would like for [place] to send in Rxs for since he has changed PCP to Dr Conley Rolls. Dr Conley Rolls, do you want to RX pt's Allopurinol 300 mg QD, and metoprolol ER 25 mg QD? I have pended them for pt w/ RFs for 6 mos since his OV, but please review and sign if agree. It looks like you did want pt to f/up.

## 2013-01-08 MED ORDER — ALLOPURINOL 300 MG PO TABS: 300.0000 mg | ORAL_TABLET | Freq: Every day | ORAL | Status: DC

## 2013-01-08 MED ORDER — METOPROLOL SUCCINATE ER 25 MG PO TB24: 25.0000 mg | ORAL_TABLET | Freq: Every day | ORAL | Status: DC

## 2013-01-08 NOTE — Telephone Encounter (Signed)
Thanks, sent in for him. Called him to advise.

## 2013-01-16 ENCOUNTER — Encounter (HOSPITAL_COMMUNITY)
Admission: EM | Disposition: A | Discharge status: Home / Self Care | Payer: Self-pay | Source: Home / Self Care | Attending: Emergency Medicine

## 2013-01-16 ENCOUNTER — Inpatient Hospital Stay (HOSPITAL_COMMUNITY)
Admission: EM | Admit: 2013-01-16 | Discharge: 2013-01-18 | DRG: 432 | Disposition: A | Discharge status: Home / Self Care | Payer: 59 | Attending: Emergency Medicine | Admitting: Emergency Medicine

## 2013-01-16 ENCOUNTER — Encounter (HOSPITAL_COMMUNITY): Payer: Self-pay

## 2013-01-16 ENCOUNTER — Emergency Department (HOSPITAL_COMMUNITY): Payer: 59

## 2013-01-16 DIAGNOSIS — D62 Acute posthemorrhagic anemia: Secondary | ICD-10-CM

## 2013-01-16 DIAGNOSIS — M109 Gout, unspecified: Secondary | ICD-10-CM | POA: Diagnosis present

## 2013-01-16 DIAGNOSIS — R791 Abnormal coagulation profile: Secondary | ICD-10-CM | POA: Diagnosis present

## 2013-01-16 DIAGNOSIS — G4700 Insomnia, unspecified: Secondary | ICD-10-CM

## 2013-01-16 DIAGNOSIS — E119 Type 2 diabetes mellitus without complications: Secondary | ICD-10-CM | POA: Diagnosis present

## 2013-01-16 DIAGNOSIS — K922 Gastrointestinal hemorrhage, unspecified: Secondary | ICD-10-CM

## 2013-01-16 DIAGNOSIS — R188 Other ascites: Secondary | ICD-10-CM | POA: Diagnosis present

## 2013-01-16 DIAGNOSIS — I8500 Esophageal varices without bleeding: Secondary | ICD-10-CM

## 2013-01-16 DIAGNOSIS — K7460 Unspecified cirrhosis of liver: Secondary | ICD-10-CM

## 2013-01-16 DIAGNOSIS — R578 Other shock: Secondary | ICD-10-CM | POA: Diagnosis present

## 2013-01-16 DIAGNOSIS — Z79899 Other long term (current) drug therapy: Secondary | ICD-10-CM

## 2013-01-16 DIAGNOSIS — D61818 Other pancytopenia: Secondary | ICD-10-CM

## 2013-01-16 DIAGNOSIS — I10 Essential (primary) hypertension: Secondary | ICD-10-CM | POA: Diagnosis present

## 2013-01-16 DIAGNOSIS — F1020 Alcohol dependence, uncomplicated: Secondary | ICD-10-CM | POA: Diagnosis present

## 2013-01-16 DIAGNOSIS — D500 Iron deficiency anemia secondary to blood loss (chronic): Secondary | ICD-10-CM | POA: Diagnosis present

## 2013-01-16 DIAGNOSIS — F411 Generalized anxiety disorder: Secondary | ICD-10-CM | POA: Diagnosis present

## 2013-01-16 DIAGNOSIS — I8511 Secondary esophageal varices with bleeding: Secondary | ICD-10-CM | POA: Diagnosis present

## 2013-01-16 DIAGNOSIS — K7030 Alcoholic cirrhosis of liver without ascites: Principal | ICD-10-CM

## 2013-01-16 HISTORY — PX: ESOPHAGOGASTRODUODENOSCOPY: SHX5428

## 2013-01-16 HISTORY — PX: ESOPHAGEAL BANDING: SHX5518

## 2013-01-16 LAB — CBC WITH DIFFERENTIAL/PLATELET
Basophils Absolute: 0 10*3/uL (ref 0.0–0.1)
Eosinophils Absolute: 0 10*3/uL (ref 0.0–0.7)
HCT: 21 % — ABNORMAL LOW (ref 39.0–52.0)
Lymphocytes Relative: 10 % — ABNORMAL LOW (ref 12–46)
MCHC: 27.6 g/dL — ABNORMAL LOW (ref 30.0–36.0)
Monocytes Relative: 9 % (ref 3–12)
Neutro Abs: 3.8 10*3/uL (ref 1.7–7.7)
Neutrophils Relative %: 81 % — ABNORMAL HIGH (ref 43–77)
Platelets: 91 10*3/uL — ABNORMAL LOW (ref 150–400)
RDW: 20.7 % — ABNORMAL HIGH (ref 11.5–15.5)
WBC: 4.7 10*3/uL (ref 4.0–10.5)

## 2013-01-16 LAB — ETHANOL: Alcohol, Ethyl (B): <11 mg/dL (ref 0–11)

## 2013-01-16 LAB — APTT: aPTT: 33 seconds (ref 24–37)

## 2013-01-16 LAB — PHOSPHORUS: Phosphorus: 3 mg/dL (ref 2.3–4.6)

## 2013-01-16 LAB — MAGNESIUM: Magnesium: 1.7 mg/dL (ref 1.5–2.5)

## 2013-01-16 LAB — CG4 I-STAT (LACTIC ACID): Lactic Acid, Venous: 2.02 mmol/L (ref 0.5–2.2)

## 2013-01-16 LAB — HEPATIC FUNCTION PANEL
Albumin: 2.9 g/dL — ABNORMAL LOW (ref 3.5–5.2)
Bilirubin, Direct: 0.5 mg/dL — ABNORMAL HIGH (ref 0.0–0.3)
Total Bilirubin: 1.2 mg/dL (ref 0.3–1.2)

## 2013-01-16 LAB — BASIC METABOLIC PANEL
BUN: 12 mg/dL (ref 6–23)
Calcium: 8 mg/dL — ABNORMAL LOW (ref 8.4–10.5)
GFR calc Af Amer: >90 mL/min (ref >90)
GFR calc non Af Amer: >90 mL/min (ref >90)
Glucose, Bld: 207 mg/dL — ABNORMAL HIGH (ref 70–99)
Sodium: 138 mEq/L (ref 135–145)

## 2013-01-16 LAB — AMMONIA: Ammonia: 94 umol/L — ABNORMAL HIGH (ref 11–60)

## 2013-01-16 LAB — HEMOGLOBIN AND HEMATOCRIT, BLOOD: Hemoglobin: 6.5 g/dL — CL (ref 13.0–17.0)

## 2013-01-16 LAB — LIPASE, BLOOD: Lipase: 60 U/L — ABNORMAL HIGH (ref 11–59)

## 2013-01-16 LAB — MRSA PCR SCREENING: MRSA by PCR: NEGATIVE

## 2013-01-16 LAB — PROTIME-INR
INR: 1.66 — ABNORMAL HIGH (ref 0.00–1.49)
Prothrombin Time: 19.5 seconds — ABNORMAL HIGH (ref 11.6–15.2)

## 2013-01-16 SURGERY — EGD (ESOPHAGOGASTRODUODENOSCOPY)
Anesthesia: Moderate Sedation

## 2013-01-16 MED ORDER — SODIUM CHLORIDE 0.9 % IV SOLN
50.0000 ug/h | INTRAVENOUS | Status: DC
  Administered 2013-01-16 – 2013-01-18 (×3): 50 ug/h via INTRAVENOUS
  Filled 2013-01-16 (×11): qty 1

## 2013-01-16 MED ORDER — ADULT MULTIVITAMIN W/MINERALS CH
1.0000 | ORAL_TABLET | Freq: Every day | ORAL | Status: DC
  Administered 2013-01-17 – 2013-01-18 (×2): 1 via ORAL
  Filled 2013-01-16 (×2): qty 1

## 2013-01-16 MED ORDER — LORAZEPAM 1 MG PO TABS
1.0000 mg | ORAL_TABLET | Freq: Four times a day (QID) | ORAL | Status: DC | PRN
  Administered 2013-01-17: 1 mg via ORAL
  Filled 2013-01-16: qty 1

## 2013-01-16 MED ORDER — ZOLPIDEM TARTRATE 5 MG PO TABS
5.0000 mg | ORAL_TABLET | Freq: Every evening | ORAL | Status: DC | PRN
  Administered 2013-01-16 – 2013-01-17 (×2): 5 mg via ORAL
  Filled 2013-01-16 (×2): qty 1

## 2013-01-16 MED ORDER — LORAZEPAM 2 MG/ML IJ SOLN
0.0000 mg | Freq: Two times a day (BID) | INTRAMUSCULAR | Status: DC
  Administered 2013-01-18: 1 mg via INTRAVENOUS
  Filled 2013-01-16: qty 1

## 2013-01-16 MED ORDER — FENTANYL CITRATE 0.05 MG/ML IJ SOLN
INTRAMUSCULAR | Status: DC | PRN
  Administered 2013-01-16 (×3): 25 ug via INTRAVENOUS

## 2013-01-16 MED ORDER — LORAZEPAM 2 MG/ML IJ SOLN
1.0000 mg | Freq: Four times a day (QID) | INTRAMUSCULAR | Status: DC | PRN
  Administered 2013-01-16: 1 mg via INTRAVENOUS
  Filled 2013-01-16: qty 1

## 2013-01-16 MED ORDER — SODIUM CHLORIDE 0.9 % IV SOLN
INTRAVENOUS | Status: DC
  Administered 2013-01-16: 15:00:00 via INTRAVENOUS

## 2013-01-16 MED ORDER — SODIUM CHLORIDE 0.9 % IV BOLUS (SEPSIS)
1000.0000 mL | Freq: Once | INTRAVENOUS | Status: AC
  Administered 2013-01-16: 1000 mL via INTRAVENOUS

## 2013-01-16 MED ORDER — SODIUM CHLORIDE 0.9 % IV SOLN: 250.0000 mL | INTRAVENOUS | Status: DC | PRN

## 2013-01-16 MED ORDER — THIAMINE HCL 100 MG/ML IJ SOLN
100.0000 mg | Freq: Every day | INTRAMUSCULAR | Status: DC
Start: 2013-01-16 — End: 2013-01-18
  Filled 2013-01-16 (×2): qty 1

## 2013-01-16 MED ORDER — SODIUM CHLORIDE 0.9 % IV SOLN
Freq: Once | INTRAVENOUS | Status: AC
  Administered 2013-01-16: 08:00:00 via INTRAVENOUS

## 2013-01-16 MED ORDER — VITAMIN B-1 100 MG PO TABS
100.0000 mg | ORAL_TABLET | Freq: Every day | ORAL | Status: DC
  Administered 2013-01-17 – 2013-01-18 (×2): 100 mg via ORAL
  Filled 2013-01-16 (×2): qty 1

## 2013-01-16 MED ORDER — ASPIRIN 300 MG RE SUPP: 300.0000 mg | RECTAL | Status: DC

## 2013-01-16 MED ORDER — OCTREOTIDE ACETATE 50 MCG/ML IJ SOLN: 50.0000 ug | Freq: Once | INTRAMUSCULAR | Status: DC

## 2013-01-16 MED ORDER — OCTREOTIDE LOAD VIA INFUSION
50.0000 ug | Freq: Once | INTRAVENOUS | Status: AC
  Administered 2013-01-16: 50 ug via INTRAVENOUS
  Filled 2013-01-16: qty 25

## 2013-01-16 MED ORDER — ONDANSETRON HCL 4 MG/2ML IJ SOLN
4.0000 mg | Freq: Once | INTRAMUSCULAR | Status: AC
  Administered 2013-01-16: 4 mg via INTRAVENOUS
  Filled 2013-01-16: qty 2

## 2013-01-16 MED ORDER — LORAZEPAM 2 MG/ML IJ SOLN
0.0000 mg | Freq: Four times a day (QID) | INTRAMUSCULAR | Status: AC
  Administered 2013-01-16: 2 mg via INTRAVENOUS
  Administered 2013-01-16: 1 mg via INTRAVENOUS
  Administered 2013-01-17: 2 mg via INTRAVENOUS
  Administered 2013-01-17: 1 mg via INTRAVENOUS
  Administered 2013-01-17: 2 mg via INTRAVENOUS
  Administered 2013-01-18: 1 mg via INTRAVENOUS
  Filled 2013-01-16 (×6): qty 1

## 2013-01-16 MED ORDER — LORAZEPAM 2 MG/ML IJ SOLN
1.0000 mg | Freq: Once | INTRAMUSCULAR | Status: AC
  Administered 2013-01-16: 1 mg via INTRAVENOUS
  Filled 2013-01-16: qty 1

## 2013-01-16 MED ORDER — MIDAZOLAM HCL 10 MG/2ML IJ SOLN
INTRAMUSCULAR | Status: DC | PRN
  Administered 2013-01-16 (×4): 2 mg via INTRAVENOUS

## 2013-01-16 MED ORDER — PANTOPRAZOLE SODIUM 40 MG IV SOLR
40.0000 mg | Freq: Two times a day (BID) | INTRAVENOUS | Status: DC
  Administered 2013-01-16 – 2013-01-18 (×5): 40 mg via INTRAVENOUS
  Filled 2013-01-16 (×6): qty 40

## 2013-01-16 MED ORDER — BUTAMBEN-TETRACAINE-BENZOCAINE 2-2-14 % EX AERO
INHALATION_SPRAY | CUTANEOUS | Status: DC | PRN
  Administered 2013-01-16: 2 via TOPICAL

## 2013-01-16 MED ORDER — SODIUM CHLORIDE 0.9 % IV SOLN
INTRAVENOUS | Status: DC
  Administered 2013-01-16 – 2013-01-18 (×2): via INTRAVENOUS

## 2013-01-16 MED ORDER — FOLIC ACID 1 MG PO TABS
1.0000 mg | ORAL_TABLET | Freq: Every day | ORAL | Status: DC
  Administered 2013-01-17 – 2013-01-18 (×2): 1 mg via ORAL
  Filled 2013-01-16 (×2): qty 1

## 2013-01-16 MED ORDER — ASPIRIN 81 MG PO CHEW: 324.0000 mg | CHEWABLE_TABLET | ORAL | Status: DC

## 2013-01-16 NOTE — ED Notes (Signed)
Per family pt vomited blood x 2 today. Pt has a recent hx of esophageal varices that were banded. Pt reports having a bright red stool today. Pt pale in skin color.Marland Kitchen

## 2013-01-16 NOTE — H&P (Addendum)
PULMONARY  / CRITICAL CARE MEDICINE  Name: Jake [name] MRN: [id number] DOB: 03/03/1956    ADMISSION DATE:  01/16/2013  REFERRING MD :  EDP PRIMARY SERVICE:  CCM  CHIEF COMPLAINT:  GI bleed   BRIEF PATIENT DESCRIPTION: 57 yo male with hx ETOH, esophageal varices s/p banding on 3/20 (followed by [name]) presented 5/7 with hematemesis and BRBPR.  PCCM called to admit.   SIGNIFICANT EVENTS / STUDIES:    LINES / TUBES:   CULTURES:   ANTIBIOTICS:   HISTORY OF PRESENT ILLNESS:  57yo male hx ETOH, esophageal varices, anxiety presents 5/7 with 1 day hx hematemesis and brbpr.  Previously underwent EGD with banding of varices 3/20 by Dr. Matthias Hughs.  Has had increased fatigue over last week and developed hematemesis around 11pm 5/6. Has had 4 episodes since then with reportedly small amounts but very bright red.  Cont to drink regularly, last ETOH 5/6. Denies chest pain, syncope, abd pain.   Mildly hypotensive in ER with hgb ~5 and PCCM called to admit.   PAST MEDICAL HISTORY :  Past Medical History  Diagnosis Date  . Blood transfusion   . Hypertension   . Diabetes mellitus   . Anemia   . Anxiety   . Gout   . Esophageal varices 11/29/12    Historical  . Cirrhosis 11/29/12    historical   Past Surgical History  Procedure Laterality Date  . Colonoscopy      2011-[facility]. Normal results per patient  . Upper gi endoscopy    . Esophagogastroduodenoscopy N/A 11/29/2012    Procedure: ESOPHAGOGASTRODUODENOSCOPY (EGD);  Surgeon: Florencia Reasons, MD;  Location: [facility];  Service: Endoscopy;  Laterality: N/A;   Prior to Admission medications   Medication Sig Start Date End Date Taking? Authorizing Provider  allopurinol (ZYLOPRIM) 300 MG tablet Take 1 tablet (300 mg total) by mouth daily. 01/07/13  Yes Thao P Le, DO  ALPRAZolam (XANAX) 0.5 MG tablet Take 1 tablet (0.5 mg total) by mouth at bedtime as needed (sleep/anxiety). 11/28/12  Yes Thao P Le, DO  ferrous sulfate 325 (65 FE) MG  tablet Take 325 mg by mouth 2 (two) times daily.    Yes Historical Provider, MD  metoprolol succinate (TOPROL-XL) 25 MG 24 hr tablet Take 1 tablet (25 mg total) by mouth daily. 01/07/13  Yes Thao P Le, DO  Multiple Vitamin (MULTIVITAMIN WITH MINERALS) TABS Take 1 tablet by mouth daily.   Yes Historical Provider, MD  omeprazole (PRILOSEC OTC) 20 MG tablet Take 20 mg by mouth 2 (two) times daily.    Yes Historical Provider, MD  thiamine 100 MG tablet Take 1 tablet (100 mg total) by mouth daily. 11/30/12  Yes Catarina Hartshorn, MD  zolpidem (AMBIEN) 10 MG tablet Take 1 tablet (10 mg total) by mouth at bedtime as needed for sleep. 11/28/12 01/16/13 Yes Thao P Le, DO   No Known Allergies  FAMILY HISTORY:  Family History  Problem Relation Age of Onset  . Hyperlipidemia Mother    SOCIAL HISTORY:  reports that he has never smoked. He has never used smokeless tobacco. He reports that he drinks about 7.2 ounces of alcohol per week. He reports that he does not use illicit drugs.  REVIEW OF SYSTEMS:   Per HPI.  All other systems reviewed and were neg.   VITAL SIGNS:   Filed Vitals:   01/16/13 0900 01/16/13 0915 01/16/13 0930 01/16/13 0937  BP: 98/55 100/56 104/58 102/54  Pulse:  98  Temp:    98.8 F (37.1 C)  TempSrc:    Oral  Resp: 21 17 17 21   SpO2:       PHYSICAL EXAMINATION: General:  Pleasant male, NAD Neuro:  Awake, alert, appropriate, MAE HEENT:  Mm dry, no JVD Cardiovascular:  s1s2 rrr Lungs:  resps even non labored Abdomen:  Distended, non tender,  hyperactive bs Ext: warm and dry, no edema    Recent Labs Lab 01/16/13 0729  NA 138  K 4.4  CL 106  CO2 25  BUN 12  CREATININE 0.55  GLUCOSE 207*    Recent Labs Lab 01/16/13 0729  HGB 5.8*  HCT 21.0*  WBC 4.7  PLT 91*    Recent Labs Lab 01/16/13 0729  INR 1.71*    Dg Chest Port 1 View  01/16/2013  *RADIOLOGY REPORT*  Clinical Data: Nausea, weakness, hematemesis, evaluate for free air  PORTABLE CHEST - 1 VIEW   Comparison: 10/01/2009; 06/17/2008  Findings:  Grossly unchanged cardiac silhouette and mediastinal contours given decreased lung volumes.  Veiling heterogeneous / consolidative opacities within the peripheral aspect of the left mid lung.  Mild cephalization of flow without frank evidence of edema.  No definite pleural effusion or pneumothorax.  No acute osseous abnormalities. No definite evidence of pneumoperitoneum.  IMPRESSION: 1.  Decreased lung volumes with apparent veiling opacities within the peripheral aspect of the left mid lung, possibly accentuated due to asymmetric overlying soft tissues though a developing air space opacity is not excluded. Further evaluation with a PA and lateral chest radiograph may be obtained as clinically indicated. 2.  No definite evidence of pneumoperitoneum on this AP portable examination.   Original Report Authenticated By: Tacey Ruiz, MD     ASSESSMENT / PLAN:  GI bleeding Hx esophageal varices  PLAN -  GI to see 5/7- Sees [name]  Anticipate EGD with ?banding  Cont octreotide gtt   Coagulopathy - in setting ETOH, presumed liver dz.  Blood loss anemia - ?losing blood over the last week with hx fatigue.  Doubt his hgb 5.8 is acute with these few episodes of what appears to be fairly small amounts hematemesis.  Thromobcytopenia PLAN -  Cbc q 6  4 units PRBC FFP  F/u INR   Hypotension - BP borderline but holding for now, likely low at baseline with liver dz.  Should improve with blood products.  PLAN -  Hold off on CVL for now but discussed with pt may need    Cirrhosis - by CT scan 01/2009. At risk for SBP or portal vein thrombosis although no clinical signs of either PLAN-  Will defer any workup of these to [name] after his eval 5/7  ETOH  PLAN -  Ativan protocol  Thiamine, folate, mvi  F/u LFT's Consider abd u/s ?ascities as above   WHITEHEART,KATHRYN, NP 01/16/2013  9:20 AM Pager: [phone] or [phone]  *Care during the  described time interval was provided by me and/or other providers on the critical care team. I have reviewed this patient's available data, including medical history, events of note, physical examination and test results as part of my evaluation.   Levy Pupa, MD, PhD 01/16/2013, 9:56 AM [facility] [phone] or if no answer [phone]

## 2013-01-16 NOTE — ED Provider Notes (Addendum)
History     CSN: [id number]  Arrival date & time 01/16/13  0709   First MD Initiated Contact with Patient 01/16/13 [phone]      Chief Complaint  Patient presents with  . Hematemesis  . Rectal Bleeding    (Consider location/radiation/quality/duration/timing/severity/associated sxs/prior treatment) HPI Comments: Pt comes in with cc of hematemesis. Pt has hx of alcoholic liver dz, esophageal varices per recent endoscopy. States that he had hematemesis x 2 this am - moderate amount of bright red blood. Pt also had 2 bloody BM, BRBPR. Pt is nauseaus, no fevers, chills. Pt is not on anticoagulants or antiplatelets. Pt does endorse feeling a little weak and dizzy. Last ETOH use was y'day. Pt has required blood transfusion in the past.  Patient is a 57 y.o. male presenting with hematochezia. The history is provided by the patient.  Rectal Bleeding  Associated symptoms include abdominal pain, nausea and vomiting. Pertinent negatives include no fever, no chest pain, no headaches and no coughing.    Past Medical History  Diagnosis Date  . Blood transfusion   . Hypertension   . Diabetes mellitus   . Anemia   . Anxiety   . Gout   . Esophageal varices 11/29/12    Historical  . Cirrhosis 11/29/12    historical    Past Surgical History  Procedure Laterality Date  . Colonoscopy      2011-[facility]. Normal results per patient  . Upper gi endoscopy    . Esophagogastroduodenoscopy N/A 11/29/2012    Procedure: ESOPHAGOGASTRODUODENOSCOPY (EGD);  Surgeon: Florencia Reasons, MD;  Location: [facility];  Service: Endoscopy;  Laterality: N/A;    Family History  Problem Relation Age of Onset  . Hyperlipidemia Mother     History  Substance Use Topics  . Smoking status: Never Smoker   . Smokeless tobacco: Never Used  . Alcohol Use: 7.2 oz/week    12 Cans of beer per week     Comment: frequent      Review of Systems  Constitutional: Negative for fever, chills and activity change.  HENT:  Negative for neck pain.   Eyes: Negative for visual disturbance.  Respiratory: Negative for cough, chest tightness and shortness of breath.   Cardiovascular: Negative for chest pain.  Gastrointestinal: Positive for nausea, vomiting, abdominal pain, blood in stool and hematochezia. Negative for abdominal distention.  Genitourinary: Negative for dysuria, enuresis and difficulty urinating.  Musculoskeletal: Negative for arthralgias.  Neurological: Positive for dizziness, weakness and light-headedness. Negative for headaches.  Hematological: Does not bruise/bleed easily.  Psychiatric/Behavioral: Negative for confusion.    Allergies  Review of patient's allergies indicates no known allergies.  Home Medications   Current Outpatient Rx  Name  Route  Sig  Dispense  Refill  . allopurinol (ZYLOPRIM) 300 MG tablet   Oral   Take 1 tablet (300 mg total) by mouth daily.   30 tablet   4   . ALPRAZolam (XANAX) 0.5 MG tablet   Oral   Take 1 tablet (0.5 mg total) by mouth at bedtime as needed (sleep/anxiety).   30 tablet   1   . ferrous sulfate 325 (65 FE) MG tablet   Oral   Take 325 mg by mouth daily with breakfast.         . metoprolol succinate (TOPROL-XL) 25 MG 24 hr tablet   Oral   Take 1 tablet (25 mg total) by mouth daily.   30 tablet   4   . Multiple Vitamin (MULTIVITAMIN  WITH MINERALS) TABS   Oral   Take 1 tablet by mouth daily.         Marland Kitchen omeprazole (PRILOSEC OTC) 20 MG tablet   Oral   Take 20 mg by mouth daily.         Marland Kitchen thiamine 100 MG tablet   Oral   Take 1 tablet (100 mg total) by mouth daily.         Marland Kitchen EXPIRED: zolpidem (AMBIEN) 10 MG tablet   Oral   Take 1 tablet (10 mg total) by mouth at bedtime as needed for sleep.   30 tablet   5     There were no vitals taken for this visit.  Physical Exam  Nursing note and vitals reviewed. Constitutional: He is oriented to person, place, and time. He appears well-developed.  HENT:  Head: Normocephalic and  atraumatic.  Eyes: Conjunctivae and EOM are normal. Pupils are equal, round, and reactive to light. No scleral icterus.  Neck: Normal range of motion. Neck supple. No JVD present.  Cardiovascular: Normal rate and regular rhythm.   Murmur heard. Pulmonary/Chest: Effort normal and breath sounds normal. No respiratory distress. He has no wheezes.  Abdominal: Soft. Bowel sounds are normal. He exhibits no distension. There is tenderness. There is no rebound and no guarding.  Epigastric tenderness  Neurological: He is alert and oriented to person, place, and time.  Skin: Skin is warm.    ED Course  Procedures (including critical care time)  Labs Reviewed  CBC WITH DIFFERENTIAL  BASIC METABOLIC PANEL  HEPATIC FUNCTION PANEL  LIPASE, BLOOD  APTT  PROTIME-INR  ETHANOL  AMMONIA  TYPE AND SCREEN   Dg Chest Port 1 View  01/16/2013  *RADIOLOGY REPORT*  Clinical Data: Nausea, weakness, hematemesis, evaluate for free air  PORTABLE CHEST - 1 VIEW  Comparison: 10/01/2009; 06/17/2008  Findings:  Grossly unchanged cardiac silhouette and mediastinal contours given decreased lung volumes.  Veiling heterogeneous / consolidative opacities within the peripheral aspect of the left mid lung.  Mild cephalization of flow without frank evidence of edema.  No definite pleural effusion or pneumothorax.  No acute osseous abnormalities. No definite evidence of pneumoperitoneum.  IMPRESSION: 1.  Decreased lung volumes with apparent veiling opacities within the peripheral aspect of the left mid lung, possibly accentuated due to asymmetric overlying soft tissues though a developing air space opacity is not excluded. Further evaluation with a PA and lateral chest radiograph may be obtained as clinically indicated. 2.  No definite evidence of pneumoperitoneum on this AP portable examination.   Original Report Authenticated By: Tacey Ruiz, MD      No diagnosis found.    MDM   Date: 01/16/2013  Rate: 94  Rhythm:  normal sinus rhythm  QRS Axis: normal  Intervals: normal  ST/T Wave abnormalities: normal  Conduction Disutrbances: none  Narrative Interpretation: unremarkable  DDx includes: Esophagitis Mallory Weiss tear [place]ave  Variceal bleeding PUD/Gastritis/ulcers Diverticular bleed Internal hemorrhoids  Pt comes in with cc of hematemesis and hematochezia. Pt has hx of liver cirrhosis and variceal bleeding.  Hemodynamically stable at arrival  -mild HR elevation. He does feel dizzy, lightheaded though.  We have secured 2 large bore iv access, and will initiate upper GI workup. Endoscopy shows no gastritis - so we will start octreotide now for the varices.  Will consult GI. Dispo - icu admission.     8:37 AM Pt's Hb is 5.7. INR is 1.7 + CCM to admit. [facility] called. Will  ask if they have any specific inr goals. No hematemesis or hematochezia since ED arrival. Blood transfusion to be started.  Derwood Kaplan, MD 01/16/13 0981  9:11 AM GI hasnt returned the page. CCM taking over. Pt notified.  CRITICAL CARE Performed by: Derwood Kaplan   Total critical care time: 45 minutes  Critical care time was exclusive of separately billable procedures and treating other patients.  Critical care was necessary to treat or prevent imminent or life-threatening deterioration.  Critical care was time spent personally by me on the following activities: development of treatment plan with patient and/or surrogate as well as nursing, discussions with consultants, evaluation of patient's response to treatment, examination of patient, obtaining history from patient or surrogate, ordering and performing treatments and interventions, ordering and review of laboratory studies, ordering and review of radiographic studies, pulse oximetry and re-evaluation of patient's condition.   Derwood Kaplan, MD 01/16/13 [id number]

## 2013-01-16 NOTE — Op Note (Signed)
[facility] [address] [place], [address]   ENDOSCOPY PROCEDURE REPORT  PATIENT: Jake [name], Jake [name]  MR#: [id number] BIRTHDATE: 1956-02-27 , 56  yrs. old GENDER: Male  ENDOSCOPIST: Vida Rigger, MD REFERRED BY:  PROCEDURE DATE:  01/16/2013 PROCEDURE:   EGD w/ band ligation of varices ASA CLASS:   Class III INDICATIONS:Hematemesis.  in patient with known varices  MEDICATIONS: Fentanyl 75 mcg IV and Versed 8 mg IV  TOPICAL ANESTHETIC:used  DESCRIPTION OF PROCEDURE:   After the risks benefits and alternatives of the procedure were thoroughly explained, informed consent was obtained.  The Pentax Gastroscope [id number]  endoscope was introduced through the mouth and advanced to the second portion of the duodenum , limited by Without limitations. the findings are recorded below and we did see aoozing varix and once we put the bander on the endoscope and reinserted the scope it was spurtingand we were able to band it and we were able to place 2 more bands at the GE junction unfortunately we did have a few misfires but elected to stop the procedure when the bleeding seemed to be controlled. The instrument was slowly withdrawn as the mucosa was fully examined.the patient tolerated the procedure well there was no obvious immediate complication         FINDINGS:1. Distal esophageal varices 4 chains probably 2+ with some red spotstatus post 2 bands placed 2. Cardia varix initially oozing spurting after bander placed status post band placed with good cessation of bleeding 3. Lots of blood in the stomach prohibited adequate visualization 4. Otherwise within normal limits EGD with good look at the antrum pylorus duodenal bulb and second portion of the duodenumCOMPLICATIONS: none  ENDOSCOPIC IMPRESSION: above   RECOMMENDATIONS:continue octreotide Will add protonic transfuse when necessary consider FFP if signs of continual bleeding and okay to use vitamin K now  and will allow sips and chips only currently and we discussed alcohol rehabilitation and cessation of drinking and followup endoscopy when necessary or in 2-3 weeks  REPEAT EXAM: when necessary or 2-3 weeks as above and I would have him scheduled prior to discharge   _______________________________ Vida Rigger, MD eSigned:  Vida Rigger, MD 01/16/2013 12:12 PM    CC:  PATIENT NAME:  Nikolaj, Geraghty MR#: [id number]

## 2013-01-16 NOTE — Consult Note (Signed)
Reason for Consult: Upper GI bleeding Referring Physician: ER physician  Jake [name] is an 57 y.o. male.  HPI: Patient seen at the request of the ER physician for hematemesis and he has a history of alcoholic cirrhosis and varices and his previous 2 endoscopies and colonoscopy was reviewed and he did not followup in our office after his last endoscopy but had been doing okay and he says he's been drinking less than he used to and had been feeling okay until yesterday when he had a coughing spell and woke up in the middle of the night feeling bad and threw up some bright red blood x2 and did have some blood in his bowels and came to the hospital and we were consulted for further workup and plans and urgent endoscopy. We discussed alcohol and cirrhosis as well as his previous endoscopy and he does not use any aspirin or nonsteroidals and has no other complaints  Past Medical History  Diagnosis Date  . Blood transfusion   . Hypertension   . Diabetes mellitus   . Anemia   . Anxiety   . Gout   . Esophageal varices 11/29/12    Historical  . Cirrhosis 11/29/12    historical    Past Surgical History  Procedure Laterality Date  . Colonoscopy      2011-[facility]. Normal results per patient  . Upper gi endoscopy    . Esophagogastroduodenoscopy N/A 11/29/2012    Procedure: ESOPHAGOGASTRODUODENOSCOPY (EGD);  Surgeon: Florencia Reasons, MD;  Location: [facility];  Service: Endoscopy;  Laterality: N/A;    Family History  Problem Relation Age of Onset  . Hyperlipidemia Mother     Social History:  reports that he has never smoked. He has never used smokeless tobacco. He reports that he drinks about 7.2 ounces of alcohol per week. He reports that he does not use illicit drugs.  Allergies: No Known Allergies  Medications: I have reviewed the patient's current medications.  Results for orders placed during the hospital encounter of 01/16/13 (from the past 48 hour(s))  CBC WITH DIFFERENTIAL      Status: Abnormal   Collection Time    01/16/13  7:29 AM      Result Value Range   WBC 4.7  4.0 - 10.5 K/uL   RBC 2.83 (*) 4.22 - 5.81 MIL/uL   Hemoglobin 5.8 (*) 13.0 - 17.0 g/dL   Comment: REPEATED TO VERIFY     CRITICAL RESULT CALLED TO, READ BACK BY AND VERIFIED WITH:     J. HAMILTON RN AT 0800 ON 05.07.14 BY SHUEA   HCT 21.0 (*) 39.0 - 52.0 %   MCV 74.2 (*) 78.0 - 100.0 fL   MCH 20.5 (*) 26.0 - 34.0 pg   MCHC 27.6 (*) 30.0 - 36.0 g/dL   RDW 16.1 (*) 09.6 - 04.5 %   Platelets 91 (*) 150 - 400 K/uL   Comment: REPEATED TO VERIFY     SPECIMEN CHECKED FOR CLOTS     PLATELET COUNT CONFIRMED BY SMEAR   Neutrophils Relative 81 (*) 43 - 77 %   Lymphocytes Relative 10 (*) 12 - 46 %   Monocytes Relative 9  3 - 12 %   Eosinophils Relative 0  0 - 5 %   Basophils Relative 0  0 - 1 %   Neutro Abs 3.8  1.7 - 7.7 K/uL   Lymphs Abs 0.5 (*) 0.7 - 4.0 K/uL   Monocytes Absolute 0.4  0.1 - 1.0  K/uL   Eosinophils Absolute 0.0  0.0 - 0.7 K/uL   Basophils Absolute 0.0  0.0 - 0.1 K/uL   RBC Morphology POLYCHROMASIA PRESENT     Comment: TEARDROP CELLS   WBC Morphology TOXIC GRANULATION    BASIC METABOLIC PANEL     Status: Abnormal   Collection Time    01/16/13  7:29 AM      Result Value Range   Sodium 138  135 - 145 mEq/L   Potassium 4.4  3.5 - 5.1 mEq/L   Chloride 106  96 - 112 mEq/L   CO2 25  19 - 32 mEq/L   Glucose, Bld 207 (*) 70 - 99 mg/dL   BUN 12  6 - 23 mg/dL   Creatinine, Ser 1.61  0.50 - 1.35 mg/dL   Calcium 8.0 (*) 8.4 - 10.5 mg/dL   GFR calc non Af Amer >90  >90 mL/min   GFR calc Af Amer >90  >90 mL/min   Comment:            The eGFR has been calculated     using the CKD EPI equation.     This calculation has not been     validated in all clinical     situations.     eGFR's persistently     <90 mL/min signify     possible Chronic Kidney Disease.  HEPATIC FUNCTION PANEL     Status: Abnormal   Collection Time    01/16/13  7:29 AM      Result Value Range   Total Protein  5.4 (*) 6.0 - 8.3 g/dL   Albumin 2.9 (*) 3.5 - 5.2 g/dL   AST 36  0 - 37 U/L   ALT 17  0 - 53 U/L   Alkaline Phosphatase 158 (*) 39 - 117 U/L   Total Bilirubin 1.2  0.3 - 1.2 mg/dL   Bilirubin, Direct 0.5 (*) 0.0 - 0.3 mg/dL   Indirect Bilirubin 0.7  0.3 - 0.9 mg/dL  LIPASE, BLOOD     Status: Abnormal   Collection Time    01/16/13  7:29 AM      Result Value Range   Lipase 60 (*) 11 - 59 U/L  APTT     Status: None   Collection Time    01/16/13  7:29 AM      Result Value Range   aPTT 33  24 - 37 seconds  PROTIME-INR     Status: Abnormal   Collection Time    01/16/13  7:29 AM      Result Value Range   Prothrombin Time 19.5 (*) 11.6 - 15.2 seconds   INR 1.71 (*) 0.00 - 1.49  TYPE AND SCREEN     Status: None   Collection Time    01/16/13  7:29 AM      Result Value Range   ABO/RH(D) A NEG     Antibody Screen NEG     Sample Expiration 01/19/2013     Unit Number [id number]     Blood Component Type RBC LR PHER1     Unit division 00     Status of Unit ISSUED     Transfusion Status OK TO TRANSFUSE     Crossmatch Result Compatible     Unit Number [id number]     Blood Component Type RBC LR PHER1     Unit division 00     Status of Unit ALLOCATED     Transfusion Status OK TO TRANSFUSE  Crossmatch Result Compatible     Unit Number [id number]     Blood Component Type RED CELLS,LR     Unit division 00     Status of Unit ALLOCATED     Transfusion Status OK TO TRANSFUSE     Crossmatch Result Compatible     Unit Number [id number]     Blood Component Type RED CELLS,LR     Unit division 00     Status of Unit ALLOCATED     Transfusion Status OK TO TRANSFUSE     Crossmatch Result Compatible    ETHANOL     Status: None   Collection Time    01/16/13  7:29 AM      Result Value Range   Alcohol, Ethyl (B) <11  0 - 11 mg/dL   Comment:            LOWEST DETECTABLE LIMIT FOR     SERUM ALCOHOL IS 11 mg/dL     FOR MEDICAL PURPOSES ONLY  AMMONIA     Status: Abnormal    Collection Time    01/16/13  7:29 AM      Result Value Range   Ammonia 94 (*) 11 - 60 umol/L  MAGNESIUM     Status: None   Collection Time    01/16/13  7:29 AM      Result Value Range   Magnesium 1.7  1.5 - 2.5 mg/dL  PHOSPHORUS     Status: None   Collection Time    01/16/13  7:29 AM      Result Value Range   Phosphorus 3.0  2.3 - 4.6 mg/dL  CG4 I-STAT (LACTIC ACID)     Status: None   Collection Time    01/16/13  8:21 AM      Result Value Range   Lactic Acid, Venous 2.02  0.5 - 2.2 mmol/L  PREPARE FRESH FROZEN PLASMA     Status: None   Collection Time    01/16/13  9:30 AM      Result Value Range   Unit Number [id number]     Blood Component Type THAWED PLASMA     Unit division 00     Status of Unit ALLOCATED     Transfusion Status OK TO TRANSFUSE      Dg Chest Port 1 View  01/16/2013  *RADIOLOGY REPORT*  Clinical Data: Nausea, weakness, hematemesis, evaluate for free air  PORTABLE CHEST - 1 VIEW  Comparison: 10/01/2009; 06/17/2008  Findings:  Grossly unchanged cardiac silhouette and mediastinal contours given decreased lung volumes.  Veiling heterogeneous / consolidative opacities within the peripheral aspect of the left mid lung.  Mild cephalization of flow without frank evidence of edema.  No definite pleural effusion or pneumothorax.  No acute osseous abnormalities. No definite evidence of pneumoperitoneum.  IMPRESSION: 1.  Decreased lung volumes with apparent veiling opacities within the peripheral aspect of the left mid lung, possibly accentuated due to asymmetric overlying soft tissues though a developing air space opacity is not excluded. Further evaluation with a PA and lateral chest radiograph may be obtained as clinically indicated. 2.  No definite evidence of pneumoperitoneum on this AP portable examination.   Original Report Authenticated By: Tacey Ruiz, MD     ROS negative except above Blood pressure 110/60, pulse 110, temperature 98.7 F (37.1 C), temperature  source Oral, resp. rate 16, height 5' 10.5" (1.791 m), weight 86.183 kg (190 lb), SpO2 99.00%. Physical Exam vital signs stable afebrile no acute  distress lungs are clear regular rate and rhythm abdomen is soft nontender questionable ascites labs reviewed  Assessment/Plan: Upper GI bleed in a patient with known varices Plan: We discussed urgent endoscopy and will proceed ASAP with further workup and plans pending those findings  Thuan Tippett E 01/16/2013, 11:13 AM

## 2013-01-16 NOTE — ED Notes (Signed)
Unable to draw HH at this time. Pt has received blood product in the last hour

## 2013-01-17 ENCOUNTER — Encounter (HOSPITAL_COMMUNITY): Payer: Self-pay | Admitting: Gastroenterology

## 2013-01-17 DIAGNOSIS — F1020 Alcohol dependence, uncomplicated: Secondary | ICD-10-CM

## 2013-01-17 LAB — BASIC METABOLIC PANEL
BUN: 12 mg/dL (ref 6–23)
CO2: 28 mEq/L (ref 19–32)
Calcium: 8.2 mg/dL — ABNORMAL LOW (ref 8.4–10.5)
GFR calc non Af Amer: >90 mL/min (ref >90)
Glucose, Bld: 135 mg/dL — ABNORMAL HIGH (ref 70–99)
Potassium: 4.2 mEq/L (ref 3.5–5.1)
Sodium: 140 mEq/L (ref 135–145)

## 2013-01-17 LAB — TYPE AND SCREEN
Unit division: 0
Unit division: 0

## 2013-01-17 LAB — PREPARE FRESH FROZEN PLASMA

## 2013-01-17 LAB — HEMOGLOBIN AND HEMATOCRIT, BLOOD
HCT: 24.6 % — ABNORMAL LOW (ref 39.0–52.0)
Hemoglobin: 7.2 g/dL — ABNORMAL LOW (ref 13.0–17.0)
Hemoglobin: 7.3 g/dL — ABNORMAL LOW (ref 13.0–17.0)
Hemoglobin: 7.6 g/dL — ABNORMAL LOW (ref 13.0–17.0)

## 2013-01-17 LAB — PROTIME-INR: INR: 1.51 — ABNORMAL HIGH (ref 0.00–1.49)

## 2013-01-17 MED ORDER — IBUPROFEN 200 MG PO TABS
200.0000 mg | ORAL_TABLET | Freq: Three times a day (TID) | ORAL | Status: DC | PRN
  Administered 2013-01-17: 200 mg via ORAL
  Filled 2013-01-17 (×3): qty 1

## 2013-01-17 MED ORDER — IBUPROFEN 200 MG PO TABS
200.0000 mg | ORAL_TABLET | Freq: Once | ORAL | Status: AC
  Administered 2013-01-17: 200 mg via ORAL
  Filled 2013-01-17: qty 1

## 2013-01-17 MED ORDER — ACETAMINOPHEN 325 MG PO TABS
650.0000 mg | ORAL_TABLET | Freq: Once | ORAL | Status: AC
  Administered 2013-01-17: 650 mg via ORAL
  Filled 2013-01-17: qty 2

## 2013-01-17 MED ORDER — IBUPROFEN 100 MG PO CHEW: 200.0000 mg | CHEWABLE_TABLET | Freq: Three times a day (TID) | ORAL | Status: DC | PRN

## 2013-01-17 NOTE — H&P (Signed)
PULMONARY  / CRITICAL CARE MEDICINE  Name: Jake [name] MRN: [id number] DOB: 1956/02/03    ADMISSION DATE:  01/16/2013  REFERRING MD :  EDP PRIMARY SERVICE:  CCM  CHIEF COMPLAINT:  GI bleed   BRIEF PATIENT DESCRIPTION: 57 yo male with hx ETOH, esophageal varices s/p banding on 3/20 (followed by [name]) presented 5/7 with hematemesis and BRBPR.  PCCM called to admit.   SIGNIFICANT EVENTS / STUDIES:  5/8 EGD>>> mult esophageal varices actively bleeding, banded x2  LINES / TUBES: none  CULTURES: none  ANTIBIOTICS: none  Subjective/Overnight:  Wants to go home.  Feeling much better.  No further hematemesis.   VITAL SIGNS: Temp:  [97.8 F (36.6 C)-99.2 F (37.3 C)] 99.2 F (37.3 C) (05/08 0800) Pulse Rate:  [88-104] 101 (05/08 1114) Resp:  [14-23] 21 (05/08 1000) BP: (75-150)/(39-103) 116/103 mmHg (05/08 1114) SpO2:  [91 %-100 %] 94 % (05/08 1000) Weight:  [209 lb 3.5 oz (94.9 kg)-214 lb 1.1 oz (97.1 kg)] 214 lb 1.1 oz (97.1 kg) (05/08 0101) Filed Vitals:   01/17/13 0600 01/17/13 0800 01/17/13 1000 01/17/13 1114  BP: 132/89 122/76 150/71 116/103  Pulse: 96 100 102 101  Temp:  99.2 F (37.3 C)    TempSrc:  Oral    Resp: 23 23 21    Height:      Weight:      SpO2: 94% 94% 94%    PHYSICAL EXAMINATION: General:  Pleasant male, NAD Neuro:  Awake, alert, appropriate, MAE HEENT:  Mm dry, no JVD Cardiovascular:  s1s2 rrr Lungs:  resps even non labored Abdomen:  Distended but less so, non tender,  hyperactive bs Ext: warm and dry, no edema    Recent Labs Lab 01/16/13 0729 01/17/13 0606  NA 138 140  K 4.4 4.2  CL 106 107  CO2 25 28  BUN 12 12  CREATININE 0.55 0.66  GLUCOSE 207* 135*    Recent Labs Lab 01/16/13 0729 01/16/13 1451 01/17/13 0050 01/17/13 0607  HGB 5.8* 6.5* 7.2* 7.3*  HCT 21.0* 21.9* 23.4* 24.6*  WBC 4.7  --   --   --   PLT 91*  --   --   --     Recent Labs Lab 01/16/13 0729 01/16/13 1451 01/17/13 0606  INR 1.71* 1.66* 1.51*     Dg Chest Port 1 View  01/16/2013  *RADIOLOGY REPORT*  Clinical Data: Nausea, weakness, hematemesis, evaluate for free air  PORTABLE CHEST - 1 VIEW  Comparison: 10/01/2009; 06/17/2008  Findings:  Grossly unchanged cardiac silhouette and mediastinal contours given decreased lung volumes.  Veiling heterogeneous / consolidative opacities within the peripheral aspect of the left mid lung.  Mild cephalization of flow without frank evidence of edema.  No definite pleural effusion or pneumothorax.  No acute osseous abnormalities. No definite evidence of pneumoperitoneum.  IMPRESSION: 1.  Decreased lung volumes with apparent veiling opacities within the peripheral aspect of the left mid lung, possibly accentuated due to asymmetric overlying soft tissues though a developing air space opacity is not excluded. Further evaluation with a PA and lateral chest radiograph may be obtained as clinically indicated. 2.  No definite evidence of pneumoperitoneum on this AP portable examination.   Original Report Authenticated By: Tacey Ruiz, MD     ASSESSMENT / PLAN:  GI bleeding Hx esophageal varices  EGD 5/7 with banding x 2  PLAN -  Cont octreotide gtt  protonix  Will need f/u EGD as outpt  Counseling re: ETOH Cont  hold on advancing diet per GI recs as to not risk knocking bands off   Coagulopathy - in setting ETOH, presumed liver dz.  Blood loss anemia - ?losing blood over the last week with hx fatigue.  Doubt his hgb 5.8 is acute with these few episodes of what appears to be fairly small amounts hematemesis.  Thromobcytopenia PLAN -  Change CBC to q12  Hold on further PRBC    Hypotension - Much improved PLAN -  Monitor   Cirrhosis - by CT scan 01/2009. At risk for SBP or portal vein thrombosis although no clinical signs of either PLAN-  Will defer any workup of these to [name] as outpt  ETOH  PLAN -  Ativan protocol  Thiamine, folate, mvi  F/u LFT's Consider abd u/s ?ascities as above -  hold for now with improved distension  outpt GI f/u  Counseling re: AA/rehab, etc.  Pt says he knows "where every AA meeting in [place] is".   Tx to floor, anticipate d/c home 5/9   [facility], NP 01/17/2013  12:01 PM Pager: [phone] or [phone]  *Care during the described time interval was provided by me and/or other providers on the critical care team. I have reviewed this patient's available data, including medical history, events of note, physical examination and test results as part of my evaluation.   Levy Pupa, MD, PhD 01/17/2013, 5:16 PM [facility] [phone] or if no answer [phone]

## 2013-01-17 NOTE — Progress Notes (Signed)
Utilization review completed.  

## 2013-01-17 NOTE — Progress Notes (Signed)
Jake [name] 11:21 AM  Subjective: Patient without signs of further bleeding and we rediscussed his condition and endoscopic findings as well as a discussion about alcohol and his work  Objective: Vital signs stable afebrile no acute distress abdomen is soft nontender hemoglobin stable BUN okay  Assessment: Cirrhosis with variceal bleed  Plan: I would wait till tomorrow to advance his diet based on not wanting to knock off any of the bands and make sure he is set up for a repeat endoscopy prior to discharge and counsel regarding aa or other alcohol rehabilitation options  [place] Huberty E

## 2013-01-17 NOTE — Progress Notes (Signed)
Nutrition Brief Note  Patient identified on the Malnutrition Screening Tool (MST) Report  Body mass index is 30.72 kg/(m^2). Patient meets criteria for Obese based on current BMI.   Current diet order is Clear liquids. Pt sleepy at time of visit and difficult to arouse. Pt states that he is doing fine. Denied wt loss and poor appetite, and has no questions or concerns at this time. Labs and medications reviewed. Pt receiving Multivitamin with minerals, Folvite, and thiamine supplements daily.  No nutrition interventions warranted at this time. If nutrition issues arise, please consult RD.   Ian Malkin RD, LDN Inpatient Clinical Dietitian Pager: [phone] After Hours Pager: [phone]

## 2013-01-18 DIAGNOSIS — I8500 Esophageal varices without bleeding: Secondary | ICD-10-CM

## 2013-01-18 LAB — HEMOGLOBIN AND HEMATOCRIT, BLOOD
HCT: 23.9 % — ABNORMAL LOW (ref 39.0–52.0)
Hemoglobin: 7 g/dL — ABNORMAL LOW (ref 13.0–17.0)

## 2013-01-18 MED ORDER — FOLIC ACID 1 MG PO TABS: 1.0000 mg | ORAL_TABLET | Freq: Every day | ORAL | Status: DC

## 2013-01-18 MED ORDER — ALPRAZOLAM 0.5 MG PO TABS: 0.5000 mg | ORAL_TABLET | Freq: Three times a day (TID) | ORAL | Status: DC | PRN

## 2013-01-18 MED ORDER — LEVOFLOXACIN 750 MG PO TABS
750.0000 mg | ORAL_TABLET | Freq: Every day | ORAL | Status: DC
  Administered 2013-01-18: 750 mg via ORAL
  Filled 2013-01-18: qty 1

## 2013-01-18 MED ORDER — PANTOPRAZOLE SODIUM 40 MG PO TBEC: 40.0000 mg | DELAYED_RELEASE_TABLET | Freq: Two times a day (BID) | ORAL | Status: DC

## 2013-01-18 MED ORDER — LEVOFLOXACIN 750 MG PO TABS: 750.0000 mg | ORAL_TABLET | Freq: Every day | ORAL | Status: DC

## 2013-01-18 MED ORDER — FERROUS SULFATE 325 (65 FE) MG PO TABS
325.0000 mg | ORAL_TABLET | Freq: Three times a day (TID) | ORAL | Status: DC
  Filled 2013-01-18 (×2): qty 1

## 2013-01-18 NOTE — Care Management Note (Signed)
    Page 1 of 1   01/18/2013     4:37:46 PM   CARE MANAGEMENT NOTE 01/18/2013  Patient:  Jake [name], Jake [name]   Account Number:  [id number]  Date Initiated:  01/18/2013  Documentation initiated by:  [facility]  Subjective/Objective Assessment:   ADMITTED W/GIB.AV:WUJWJ CIRRHOSIS.     Action/Plan:   FROM HOME.HAS PCP,PHARMACY.   Anticipated DC Date:  01/18/2013   Anticipated DC Plan:  HOME/SELF CARE      DC Planning Services  CM consult      Choice offered to / List presented to:             Status of service:  Completed, signed off Medicare Important Message given?   (If response is "NO", the following Medicare IM given date fields will be blank) Date Medicare IM given:   Date Additional Medicare IM given:    Discharge Disposition:  HOME/SELF CARE  Per UR Regulation:  Reviewed for med. necessity/level of care/duration of stay  If discussed at Long Length of Stay Meetings, dates discussed:    Comments:  01/18/13 Adarian Bur RN,BSN NCM [phone] TRANSFER FROM SDU.D/C HOME NO D/C NEEDS OR ORDERS.

## 2013-01-18 NOTE — Progress Notes (Signed)
Jake [name] 1:05 PM  Subjective: Patient doing better still weak and we rediscussed his case and no alcohol and has no new complaints and no obvious further bleeding  Objective: Vital signs stable he did have a temperature abdomen is soft nontender questionable some  Ascites hemoglobin slight decrease BUN normal Assessment: Cirrhosis and varices status post banding  Plan: Agree with advancing diet today consider one more unit of blood and discussed with he and his wife no more alcohol and to set up an endoscopy in roughly 2 weeks as an outpatient and no aspirin or nonsteroidals as an outpatient and no more than 4 Tylenol a day  Mario Coronado E

## 2013-01-18 NOTE — Discharge Summary (Signed)
Physician Discharge Summary     Patient ID: Jake [name] MRN: [id number] DOB/AGE: 1956/04/18 57 y.o.  Admit date: 01/16/2013 Discharge date: 01/18/2013  Admission Diagnoses: Shock acute gi bleed  Discharge Diagnoses:   GI bleeding due to esophageal varices  Hx esophageal varices  EGD 5/7 with banding x 2  Coagulopathy Blood loss anemia Thromobcytopenia Cirrhosis -ETOH  Fever and Left lower lobe atelectasis vs infiltrate.  Significant Hospital tests/ studies/ interventions and procedures  5/8 EGD>>> mult esophageal varices actively bleeding, banded x Hospital Course:  GI bleeding due to esophageal varices  Hx esophageal varices  EGD 5/7 with banding x 2  Coagulopathy - in setting ETOH, presumed liver dz.  Blood loss anemia: in setting of variceal bleed Thromobcytopenia: in setting of cirrhosis and bleeding, stable at discharge  Cirrhosis - by CT scan 01/2009. At risk for SBP or portal vein thrombosis although no clinical signs Will defer any workup of these to [name] as outpt  ETOH  Fever and Left lower lobe atelectasis vs infiltrate.    57 yo male with hx ETOH, esophageal varices s/p banding on 3/20 (followed by [name]) presented 5/7 with hematemesis and BRBPR. PCCM called to admit. Therapeutic interventions included: proton pump inhibitors, transfusion of PRBCs (4 units), and FFP. GI was called. He was seen by [name]. He underwent EGD on 5/7 (emergently). During this 2 bleeding varices were banded. He was monitored in the ICU the following day w/ slow progression of diet and serial CBCs to ensure stable hemoglobin. His hospital course has been complicated by fever first noted on 5/8. CXR obtained raises suspicion for LLL infiltrate. For this he was started on levaquin at discharge. At time of discharge his Hbg is 7 but he is asymptomatic. He will be discharged to home with the following instructions:  Advance diet No ETOH, ASA or NSAIDs F/u with GI, 2 weeks  No more than  4 APAP in 1 day 7 days of levaquin Refrain from heavy activity See PCP for CBC on 5/12  Discharge Exam: BP 122/80  Pulse 118  Temp(Src) 101.1 F (38.4 C) (Oral)  Resp 22  Ht 5' 10.5" (1.791 m)  Wt 95.4 kg (210 lb 5.1 oz)  BMI 29.74 kg/m2  SpO2 91%  PHYSICAL EXAMINATION:  General: Pleasant male, NAD  Neuro: Awake, alert, appropriate, MAE  HEENT: Mm dry, no JVD  Cardiovascular: s1s2 rrr  Lungs: resps even non labored  Abdomen: Distended but less so, non tender, hyperactive bs  Ext: warm and dry, no edema    Labs at discharge Lab Results  Component Value Date   CREATININE 0.66 01/17/2013   BUN 12 01/17/2013   NA 140 01/17/2013   K 4.2 01/17/2013   CL 107 01/17/2013   CO2 28 01/17/2013   Lab Results  Component Value Date   WBC 4.7 01/16/2013   HGB 7.0* 01/18/2013   HCT 23.9* 01/18/2013   MCV 74.2* 01/16/2013   PLT 91* 01/16/2013   Lab Results  Component Value Date   ALT 17 01/16/2013   AST 36 01/16/2013   ALKPHOS 158* 01/16/2013   BILITOT 1.2 01/16/2013   Lab Results  Component Value Date   INR 1.51* 01/17/2013   INR 1.66* 01/16/2013   INR 1.71* 01/16/2013    Current radiology studies No results found.  Disposition:  01-Home or Self Care      Discharge Orders   Future Orders Complete By Expires     Diet - low sodium heart  healthy  As directed     Discharge instructions  As directed     Comments:      No NSAIDs (aspirin, alieve, ibuprofen, motrin, goodies) No alcohol Take it easy until you have your blood work checked Take your medications as perscribed.    Increase activity slowly  As directed         Medication List    STOP taking these medications       allopurinol 300 MG tablet  Commonly known as:  ZYLOPRIM     omeprazole 20 MG tablet  Commonly known as:  PRILOSEC OTC      TAKE these medications       ALPRAZolam 0.5 MG tablet  Commonly known as:  XANAX  Take 1 tablet (0.5 mg total) by mouth 3 (three) times daily as needed (sleep/anxiety).     ferrous sulfate  325 (65 FE) MG tablet  Take 325 mg by mouth 2 (two) times daily.     folic acid 1 MG tablet  Commonly known as:  FOLVITE  Take 1 tablet (1 mg total) by mouth daily.     levofloxacin 750 MG tablet  Commonly known as:  LEVAQUIN  Take 1 tablet (750 mg total) by mouth daily.     metoprolol succinate 25 MG 24 hr tablet  Commonly known as:  TOPROL-XL  Take 1 tablet (25 mg total) by mouth daily.     multivitamin with minerals Tabs  Take 1 tablet by mouth daily.     pantoprazole 40 MG tablet  Commonly known as:  PROTONIX  Take 1 tablet (40 mg total) by mouth 2 (two) times daily before a meal.     thiamine 100 MG tablet  Take 1 tablet (100 mg total) by mouth daily.     zolpidem 10 MG tablet  Commonly known as:  AMBIEN  Take 1 tablet (10 mg total) by mouth at bedtime as needed for sleep.       Follow-up Information   Follow up with LE, [name], DO. Schedule an appointment as soon as possible for a visit on 01/21/2013. (see Dr Conley Rolls on Monday, you need to have a CBC checked. )    Contact information:   [address] [place] [address] [phone]       Follow up with [name], MD On 01/28/2013. (845 am)    Contact information:   [address] [place] [address] [place] [address] [phone]       Discharged Condition: good  Physician Statement:   The Patient was personally examined, the discharge assessment and plan has been personally reviewed and I agree with ACNP [name]'s assessment and plan. > 30 minutes of time have been dedicated to discharge assessment, planning and discharge instructions.   Signed: Jerzey Komperda,PETE 01/18/2013, 2:34 PM

## 2013-01-18 NOTE — Progress Notes (Signed)
PULMONARY  / CRITICAL CARE MEDICINE  Name: Jake [name] MRN: [id number] DOB: [birth year]    ADMISSION DATE:  01/16/2013  REFERRING MD :  EDP PRIMARY SERVICE:  CCM  CHIEF COMPLAINT:  GI bleed   BRIEF PATIENT DESCRIPTION: 57 yo male with hx ETOH, esophageal varices s/p banding on 3/20 (followed by [name]) presented 5/7 with hematemesis and BRBPR.  PCCM called to admit.   SIGNIFICANT EVENTS / STUDIES:  5/8 EGD>>> mult esophageal varices actively bleeding, banded x2  LINES / TUBES: PIV  CULTURES: none  ANTIBIOTICS: levaquin 5/9 (?LLL infiltrate ? Asp)  Subjective/Overnight:  Wants to go home.  Feeling much better.  No further hematemesis.   VITAL SIGNS: Temp:  [99.9 F (37.7 C)-103 F (39.4 C)] 101.1 F (38.4 C) (05/09 0651) Pulse Rate:  [108-121] 118 (05/09 1001) Resp:  [20-28] 22 (05/09 0651) BP: (109-127)/(45-80) 122/80 mmHg (05/09 1001) SpO2:  [91 %-95 %] 91 % (05/09 0651) Weight:  [95.4 kg (210 lb 5.1 oz)] 95.4 kg (210 lb 5.1 oz) (05/08 1956) Filed Vitals:   01/17/13 1956 01/17/13 2208 01/18/13 0651 01/18/13 1001  BP: 109/53  115/71 122/80  Pulse: 115  108 118  Temp: 102 F (38.9 C) 99.9 F (37.7 C) 101.1 F (38.4 C)   TempSrc: Oral Oral Oral   Resp: 22  22   Height: 5' 10.5" (1.791 m)     Weight: 95.4 kg (210 lb 5.1 oz)     SpO2: 93%  91%    PHYSICAL EXAMINATION: General:  Pleasant male, NAD Neuro:  Awake, alert, appropriate, MAE HEENT:  Mm dry, no JVD Cardiovascular:  s1s2 rrr Lungs:  resps even non labored Abdomen:  Distended but less so, non tender,  hyperactive bs Ext: warm and dry, no edema    Recent Labs Lab 01/16/13 0729 01/17/13 0606  NA 138 140  K 4.4 4.2  CL 106 107  CO2 25 28  BUN 12 12  CREATININE 0.55 0.66  GLUCOSE 207* 135*    Recent Labs Lab 01/16/13 0729  01/17/13 0607 01/17/13 1749 01/18/13 0615  HGB 5.8*  < > 7.3* 7.6* 7.0*  HCT 21.0*  < > 24.6* 26.1* 23.9*  WBC 4.7  --   --   --   --   PLT 91*  --   --   --   --    < > = values in this interval not displayed.  Recent Labs Lab 01/16/13 0729 01/16/13 1451 01/17/13 0606  INR 1.71* 1.66* 1.51*    No results found.  ASSESSMENT / PLAN:  GI bleeding Hx esophageal varices  EGD 5/7 with banding x 2  PLAN -  D/c  octreotide gtt  protonix  Will need f/u EGD as outpt  Counseling re: ETOH Adv diet now   Coagulopathy - in setting ETOH, presumed liver dz.  Blood loss anemia  Thromobcytopenia PLAN -  Hold on further PRBC  Needs cbc check in 7days as outpt either GI or PCP     Cirrhosis - by CT scan 01/2009. At risk for SBP or portal vein thrombosis although no clinical signs of either PLAN-  Will defer any workup of these to [name] as outpt  ETOH  PLAN -  Ativan prn po  Thiamine/folic acid  D/c home 5/9 PM today  Fever and LLL ATX vs infiltrate Plan levaquin 750/d x 7days   Dorcas Carrow 01/18/2013, 11:35 AM [facility]  [phone]  Cell  [phone]  If no response or cell goes to voicemail, call beeper [phone]

## 2013-01-19 NOTE — Discharge Summary (Signed)
Agree with above note. Luisa Hart WrightMD

## 2013-01-20 ENCOUNTER — Ambulatory Visit (INDEPENDENT_AMBULATORY_CARE_PROVIDER_SITE_OTHER): Payer: 59 | Admitting: Family Medicine

## 2013-01-20 VITALS — BP 109/71 | HR 108 | Temp 98.7°F | Resp 18 | Ht 69.0 in | Wt 206.0 lb

## 2013-01-20 DIAGNOSIS — K7460 Unspecified cirrhosis of liver: Secondary | ICD-10-CM

## 2013-01-20 DIAGNOSIS — K922 Gastrointestinal hemorrhage, unspecified: Secondary | ICD-10-CM

## 2013-01-20 DIAGNOSIS — D649 Anemia, unspecified: Secondary | ICD-10-CM

## 2013-01-20 LAB — POCT CBC
Granulocyte percent: 61.8 %G (ref 37–80)
HCT, POC: 28.2 % — AB (ref 43.5–53.7)
Hemoglobin: 8.2 g/dL — AB (ref 14.1–18.1)
Lymph, poc: 1 (ref 0.6–3.4)
MCH, POC: 23.1 pg — AB (ref 27–31.2)
MCHC: 29.1 g/dL — AB (ref 31.8–35.4)
MCV: 79.4 fL — AB (ref 80–97)
MID (cbc): 0.3 (ref 0–0.9)
POC Granulocyte: 2.2 (ref 2–6.9)
POC LYMPH PERCENT: 28.9 %L (ref 10–50)
POC MID %: 9.3 % (ref 0–12)
Platelet Count, POC: 82 10*3/uL — AB (ref 142–424)
RBC: 3.55 M/uL — AB (ref 4.69–6.13)
RDW, POC: 23.9 %
WBC: 3.6 10*3/uL — AB (ref 4.6–10.2)

## 2013-01-20 LAB — BASIC METABOLIC PANEL WITH GFR
BUN: 8 mg/dL (ref 6–23)
CO2: 24 meq/L (ref 19–32)
Calcium: 8.2 mg/dL — ABNORMAL LOW (ref 8.4–10.5)
Creat: 0.6 mg/dL (ref 0.50–1.35)

## 2013-01-20 LAB — BASIC METABOLIC PANEL
Chloride: 106 mEq/L (ref 96–112)
Glucose, Bld: 125 mg/dL — ABNORMAL HIGH (ref 70–99)
Potassium: 3.8 mEq/L (ref 3.5–5.3)
Sodium: 136 mEq/L (ref 135–145)

## 2013-01-20 NOTE — Progress Notes (Signed)
[facility]:  Office Visit  Chief Complaint:  Chief Complaint  Patient presents with  . Follow-up    requests Dr. Conley Rolls - F/up from hospitalization per pt    HPI: Jake [name] is a 57 y.o. male who complains of:  1. Hospital follow-up for GIB secondary to esophageal varices s/p banding from excessive etoh use and liver cirrhosis-CBC  At discharge was low- it was 7. Was in [facility] 5/7and was discharge on 01/18/13 Friday. Last drank on Tuesday night. He has not had any since then. He has been in the hospital for the same reason several times in the last 3 months. Chart review conducted.  2. Next appt with GI doctor, Dr. Ganam--May 28th for repeat endoscopy.   3. Has had increase in abdominal distension, still soft . Sxs were worse on Friday. Denies nausea, vomiting, abd pain, cough, Jaykob Minichiello swelling. No current AMS.     Past Medical History  Diagnosis Date  . Blood transfusion   . Hypertension   . Diabetes mellitus   . Anemia   . Anxiety   . Gout   . Esophageal varices 11/29/12    Historical  . Cirrhosis 11/29/12    historical   Past Surgical History  Procedure Laterality Date  . Colonoscopy      2011-[facility]. Normal results per patient  . Upper gi endoscopy    . Esophagogastroduodenoscopy N/A 11/29/2012    Procedure: ESOPHAGOGASTRODUODENOSCOPY (EGD);  Surgeon: Florencia Reasons, MD;  Location: [facility];  Service: Endoscopy;  Laterality: N/A;  . Esophagogastroduodenoscopy N/A 01/16/2013    Procedure: ESOPHAGOGASTRODUODENOSCOPY (EGD);  Surgeon: Petra Kuba, MD;  Location: [facility];  Service: Endoscopy;  Laterality: N/A;   ? bedside case  . Esophageal banding N/A 01/16/2013    Procedure: ESOPHAGEAL BANDING;  Surgeon: Petra Kuba, MD;  Location: [facility];  Service: Endoscopy;  Laterality: N/A;   History   Social History  . Marital Status: Married    Spouse Name: N/A    Number of Children: N/A  . Years of Education: N/A   Social History Main Topics   . Smoking status: Never Smoker   . Smokeless tobacco: Never Used  . Alcohol Use: 7.2 oz/week    12 Cans of beer per week     Comment: frequent  . Drug Use: No  . Sexually Active: Yes   Other Topics Concern  . None   Social History Narrative  . None   Family History  Problem Relation Age of Onset  . Hyperlipidemia Mother    No Known Allergies Prior to Admission medications   Medication Sig Start Date End Date Taking? Authorizing Provider  ALPRAZolam Prudy Feeler) 0.5 MG tablet Take 1 tablet (0.5 mg total) by mouth 3 (three) times daily as needed (sleep/anxiety). 01/18/13  Yes Simonne Martinet, NP  ferrous sulfate 325 (65 FE) MG tablet Take 325 mg by mouth 2 (two) times daily.    Yes Historical Provider, MD  folic acid (FOLVITE) 1 MG tablet Take 1 tablet (1 mg total) by mouth daily. 01/18/13  Yes Simonne Martinet, NP  levofloxacin (LEVAQUIN) 750 MG tablet Take 1 tablet (750 mg total) by mouth daily. 01/18/13  Yes Simonne Martinet, NP  Multiple Vitamin (MULTIVITAMIN WITH MINERALS) TABS Take 1 tablet by mouth daily.   Yes Historical Provider, MD  pantoprazole (PROTONIX) 40 MG tablet Take 1 tablet (40 mg total) by mouth 2 (two) times daily before a meal. 01/18/13  Yes  Simonne Martinet, NP  thiamine 100 MG tablet Take 1 tablet (100 mg total) by mouth daily. 11/30/12  Yes Catarina Hartshorn, MD  metoprolol succinate (TOPROL-XL) 25 MG 24 hr tablet Take 1 tablet (25 mg total) by mouth daily. 01/07/13   Tavita Eastham P Kaelyn Nauta, DO  zolpidem (AMBIEN) 10 MG tablet Take 1 tablet (10 mg total) by mouth at bedtime as needed for sleep. 11/28/12 01/16/13  Avamarie Crossley P Latasia Silberstein, DO     ROS: The patient denies fevers, chills, night sweats, unintentional weight loss, chest pain, palpitations, wheezing, dyspnea on exertion, nausea, vomiting, abdominal pain, dysuria, hematuria, melena, numbness, or tingling.   All other systems have been reviewed and were otherwise negative with the exception of those mentioned in the HPI and as above.    PHYSICAL  EXAM: Filed Vitals:   01/20/13 1259  BP: 109/71  Pulse: 108  Temp: 98.7 F (37.1 C)  Resp: 18   Filed Vitals:   01/20/13 1259  Height: 5\' 9"  (1.753 m)  Weight: 206 lb (93.441 kg)   Body mass index is 30.41 kg/(m^2).  General: Alert, no acute distress HEENT:  Normocephalic, atraumatic, oropharynx patent.  Cardiovascular:  Tachycardic sinus rhythm, no rubs murmurs or gallops.  No Carotid bruits, radial pulse intact. No pedal edema.  Respiratory: Clear to auscultation bilaterally.  No wheezes, rales, or rhonchi.  No cyanosis, no use of accessory musculature GI: No organomegaly, + distended abdomen but is soft and non-tender, positive bowel sounds.  No masses. Skin: No rashes. Neurologic: Facial musculature symmetric. Psychiatric: Patient is appropriate throughout our interaction. Lymphatic: No cervical lymphadenopathy Musculoskeletal: Gait intact.   LABS: Results for orders placed in visit on 01/20/13  POCT CBC      Result Value Range   WBC 3.6 (*) 4.6 - 10.2 K/uL   Lymph, poc 1.0  0.6 - 3.4   POC LYMPH PERCENT 28.9  10 - 50 %L   MID (cbc) 0.3  0 - 0.9   POC MID % 9.3  0 - 12 %M   POC Granulocyte 2.2  2 - 6.9   Granulocyte percent 61.8  37 - 80 %G   RBC 3.55 (*) 4.69 - 6.13 M/uL   Hemoglobin 8.2 (*) 14.1 - 18.1 g/dL   HCT, POC 78.2 (*) 95.6 - 53.7 %   MCV 79.4 (*) 80 - 97 fL   MCH, POC 23.1 (*) 27 - 31.2 pg   MCHC 29.1 (*) 31.8 - 35.4 g/dL   RDW, POC 21.3     Platelet Count, POC 82 (*) 142 - 424 K/uL   MPV    0 - 99.8 fL     EKG/XRAY:   Primary read interpreted by Dr. Conley Rolls at [facility].   ASSESSMENT/PLAN: Encounter Diagnoses  Name Primary?  . GI bleed Yes  . Anemia     Patient's CBC is stable-Hgb trending up from 7 to 8.2 since discharge. Thrombocytopenia is still present, he does have liver cirrhosis.  His BP is on low end and has had some dizziness and weakness but no other new sxs. Dr. Beverely Risen is his GI doctor [facility] gastroenterology) BMP pending C/w Xanax for  etoh withdrawal sxs and anxiety If he is able to be without etoh on f/u visit I will consider changing benzo to Klonopin He is worried about his abd distension, abd is soft but he states it was worse on Friday. Advise to moniotr for s/sx worsening ascites. At this time I am not sure if Lasix or spironolactone  would be of any benefit since his BP is already low. Continue to monitor.  F/u in 1 month     Dajon Rowe PHUONG, DO 01/20/2013 2:00 PM

## 2013-01-23 ENCOUNTER — Telehealth: Payer: Self-pay

## 2013-01-23 NOTE — Telephone Encounter (Signed)
Can you let him know his elecetrolytes are normal. Kidney looks good. Please ask him how his belly is doing, if he has gain more water weight or has it gotten harder around his stomach. If so let me know and I will call him. He states he is better, his abdomen is not as tight, not as prominent. He states he has swelling now around his ankles. He has tried to limit his fluid intake, wants to know if you think he needs a diuretic. Please advise.

## 2013-01-23 NOTE — Telephone Encounter (Signed)
Pt had missed call about his labs he wants someone to call him back today Call back number is [phone]

## 2013-02-01 ENCOUNTER — Other Ambulatory Visit: Payer: Self-pay | Admitting: Gastroenterology

## 2013-02-01 NOTE — Addendum Note (Signed)
Addended byVida Rigger on: 02/01/2013 12:18 PM   Modules accepted: Orders

## 2013-02-05 ENCOUNTER — Encounter (HOSPITAL_COMMUNITY)
Admission: RE | Admit: 2013-02-05 | Discharge: 2013-02-05 | Disposition: A | Discharge status: Home / Self Care | Payer: 59 | Source: Ambulatory Visit | Attending: Gastroenterology | Admitting: Gastroenterology

## 2013-02-05 DIAGNOSIS — D509 Iron deficiency anemia, unspecified: Secondary | ICD-10-CM | POA: Insufficient documentation

## 2013-02-05 LAB — TYPE AND SCREEN
ABO/RH(D): A NEG
Antibody Screen: NEGATIVE

## 2013-02-05 LAB — COMPREHENSIVE METABOLIC PANEL
ALT: 20 U/L (ref 0–53)
Albumin: 3.2 g/dL — ABNORMAL LOW (ref 3.5–5.2)
Alkaline Phosphatase: 180 U/L — ABNORMAL HIGH (ref 39–117)
Chloride: 102 mEq/L (ref 96–112)
Glucose, Bld: 144 mg/dL — ABNORMAL HIGH (ref 70–99)
Potassium: 4.3 mEq/L (ref 3.5–5.1)
Sodium: 137 mEq/L (ref 135–145)
Total Bilirubin: 1.8 mg/dL — ABNORMAL HIGH (ref 0.3–1.2)
Total Protein: 6.6 g/dL (ref 6.0–8.3)

## 2013-02-06 ENCOUNTER — Other Ambulatory Visit: Payer: Self-pay | Admitting: Gastroenterology

## 2013-02-06 DIAGNOSIS — D62 Acute posthemorrhagic anemia: Secondary | ICD-10-CM

## 2013-02-06 DIAGNOSIS — D500 Iron deficiency anemia secondary to blood loss (chronic): Secondary | ICD-10-CM

## 2013-02-06 LAB — CBC
HCT: 28.9 % — ABNORMAL LOW (ref 39.0–52.0)
Hemoglobin: 8.5 g/dL — ABNORMAL LOW (ref 13.0–17.0)
MCH: 21.7 pg — ABNORMAL LOW (ref 26.0–34.0)
MCHC: 29.4 g/dL — ABNORMAL LOW (ref 30.0–36.0)
RBC: 3.91 MIL/uL — ABNORMAL LOW (ref 4.22–5.81)

## 2013-02-07 ENCOUNTER — Encounter (HOSPITAL_COMMUNITY)
Admission: RE | Disposition: A | Discharge status: Home / Self Care | Payer: Self-pay | Source: Ambulatory Visit | Attending: Gastroenterology

## 2013-02-07 ENCOUNTER — Ambulatory Visit (HOSPITAL_COMMUNITY)
Admission: RE | Admit: 2013-02-07 | Discharge: 2013-02-07 | Disposition: A | Discharge status: Home / Self Care | Payer: 59 | Source: Ambulatory Visit | Attending: Gastroenterology | Admitting: Gastroenterology

## 2013-02-07 ENCOUNTER — Encounter (HOSPITAL_COMMUNITY): Payer: Self-pay | Admitting: *Deleted

## 2013-02-07 DIAGNOSIS — I8501 Esophageal varices with bleeding: Secondary | ICD-10-CM | POA: Insufficient documentation

## 2013-02-07 HISTORY — PX: ESOPHAGEAL BANDING: SHX5518

## 2013-02-07 HISTORY — DX: Gastro-esophageal reflux disease without esophagitis: K21.9

## 2013-02-07 HISTORY — PX: ESOPHAGOGASTRODUODENOSCOPY: SHX5428

## 2013-02-07 SURGERY — EGD (ESOPHAGOGASTRODUODENOSCOPY)
Anesthesia: Moderate Sedation

## 2013-02-07 MED ORDER — DIPHENHYDRAMINE HCL 50 MG/ML IJ SOLN
INTRAMUSCULAR | Status: AC
  Filled 2013-02-07: qty 1

## 2013-02-07 MED ORDER — FENTANYL CITRATE 0.05 MG/ML IJ SOLN
INTRAMUSCULAR | Status: DC | PRN
  Administered 2013-02-07 (×4): 25 ug via INTRAVENOUS

## 2013-02-07 MED ORDER — BUTAMBEN-TETRACAINE-BENZOCAINE 2-2-14 % EX AERO
INHALATION_SPRAY | CUTANEOUS | Status: DC | PRN
  Administered 2013-02-07: 2 via TOPICAL

## 2013-02-07 MED ORDER — MIDAZOLAM HCL 10 MG/2ML IJ SOLN
INTRAMUSCULAR | Status: DC | PRN
  Administered 2013-02-07 (×5): 2 mg via INTRAVENOUS

## 2013-02-07 MED ORDER — SODIUM CHLORIDE 0.9 % IV SOLN: INTRAVENOUS | Status: DC

## 2013-02-07 MED ORDER — FENTANYL CITRATE 0.05 MG/ML IJ SOLN
INTRAMUSCULAR | Status: AC
  Filled 2013-02-07: qty 2

## 2013-02-07 MED ORDER — MIDAZOLAM HCL 10 MG/2ML IJ SOLN
INTRAMUSCULAR | Status: AC
  Filled 2013-02-07: qty 2

## 2013-02-07 NOTE — Op Note (Signed)
[facility] [address] [place], [address]   ENDOSCOPY PROCEDURE REPORT  PATIENT: Chaston, Bradburn  MR#: [id number] BIRTHDATE: 05-03-1956 , 57  yrs. old GENDER: Male  ENDOSCOPIST: Vida Rigger, MD REFERRED BY:  PROCEDURE DATE:  02/07/2013 PROCEDURE:   EGD w/ band ligation of varices ASA CLASS:   Class II INDICATIONS:history of variceal bleeding.  MEDICATIONS: Fentanyl 100 mcg IV and Versed 10 mg IV  TOPICAL ANESTHETIC:used  DESCRIPTION OF PROCEDURE:   After the risks benefits and alternatives of the procedure were thoroughly explained, informed consent was obtained.  The Pentax Gastroscope [id number]  endoscope was introduced through the mouth and advanced to the second portion of the duodenum , limited by Without limitations.   The instrument was slowly withdrawn as the mucosa was fully examined.there was no signs of bleeding and overall his endoscopic findings were improved with no gastric varices seen and minimal if any portal gastropathyand3 or 4 chains of trace to 1+ varices in the distal esophaguswhich we elected to band in the customaryfashion and the patient tolerated the procedure well and there was no obvious immediate complication        FINDINGS:1. Trace to 1+ distal esophageal varices status post banding x5 2. Proximal stomach healing ulcer from previous band 3 minimal portal gastropathy 4 otherwise within normal limits EGD  COMPLICATIONS:none  ENDOSCOPIC IMPRESSION: above   RECOMMENDATIONS:will try increasing beta blockers Will increase iron and rediscussed no alcohol and try to followup in the office in 3-4 weeks  REPEAT EXAM: probably recheck in one to 2 months   _______________________________ Vida Rigger, MD eSigned:  Vida Rigger, MD 02/07/2013 8:21 AM    CC:  PATIENT NAME:  Tayvion, Lauder MR#: [id number]

## 2013-02-11 ENCOUNTER — Encounter (HOSPITAL_COMMUNITY): Payer: Self-pay | Admitting: Gastroenterology

## 2013-04-13 ENCOUNTER — Ambulatory Visit: Payer: 59 | Admitting: Family Medicine

## 2013-04-13 VITALS — BP 100/60 | HR 75 | Temp 98.0°F | Resp 16 | Ht 68.5 in | Wt 196.0 lb

## 2013-04-13 DIAGNOSIS — G4700 Insomnia, unspecified: Secondary | ICD-10-CM

## 2013-04-13 DIAGNOSIS — F1021 Alcohol dependence, in remission: Secondary | ICD-10-CM

## 2013-04-13 DIAGNOSIS — Z87898 Personal history of other specified conditions: Secondary | ICD-10-CM

## 2013-04-13 DIAGNOSIS — K922 Gastrointestinal hemorrhage, unspecified: Secondary | ICD-10-CM

## 2013-04-13 DIAGNOSIS — F411 Generalized anxiety disorder: Secondary | ICD-10-CM

## 2013-04-13 LAB — COMPREHENSIVE METABOLIC PANEL WITH GFR
Albumin: 4.1 g/dL (ref 3.5–5.2)
Alkaline Phosphatase: 188 U/L — ABNORMAL HIGH (ref 39–117)
BUN: 6 mg/dL (ref 6–23)
Calcium: 8.7 mg/dL (ref 8.4–10.5)
Chloride: 105 meq/L (ref 96–112)
Creat: 0.62 mg/dL (ref 0.50–1.35)
Glucose, Bld: 144 mg/dL — ABNORMAL HIGH (ref 70–99)
Potassium: 4.2 meq/L (ref 3.5–5.3)

## 2013-04-13 LAB — POCT CBC
Granulocyte percent: 60.8 % (ref 37–80)
HCT, POC: 32.9 % — AB (ref 43.5–53.7)
Hemoglobin: 9.4 g/dL — AB (ref 14.1–18.1)
Lymph, poc: 0.8 (ref 0.6–3.4)
MCH, POC: 20.7 pg — AB (ref 27–31.2)
MCHC: 28.6 g/dL — AB (ref 31.8–35.4)
MCV: 72.3 fL — AB (ref 80–97)
MID (cbc): 0.4 (ref 0–0.9)
POC Granulocyte: 1.8 — AB (ref 2–6.9)
POC LYMPH PERCENT: 26.9 % (ref 10–50)
POC MID %: 12.3 %M — AB (ref 0–12)
Platelet Count, POC: 94 10*3/uL — AB (ref 142–424)
RBC: 4.55 M/uL — AB (ref 4.69–6.13)
RDW, POC: 19.9 %
WBC: 3 10*3/uL — AB (ref 4.6–10.2)

## 2013-04-13 LAB — COMPREHENSIVE METABOLIC PANEL
ALT: 33 U/L (ref 0–53)
AST: 68 U/L — ABNORMAL HIGH (ref 0–37)
CO2: 25 mEq/L (ref 19–32)
Sodium: 137 mEq/L (ref 135–145)
Total Bilirubin: 1.5 mg/dL — ABNORMAL HIGH (ref 0.3–1.2)
Total Protein: 6.7 g/dL (ref 6.0–8.3)

## 2013-04-13 MED ORDER — FOLIC ACID 1 MG PO TABS: 1.0000 mg | ORAL_TABLET | Freq: Every day | ORAL | Status: DC

## 2013-04-13 MED ORDER — PROPRANOLOL HCL ER 60 MG PO CP24: 60.0000 mg | ORAL_CAPSULE | Freq: Every day | ORAL | Status: DC

## 2013-04-13 MED ORDER — THIAMINE HCL 100 MG PO TABS: 100.0000 mg | ORAL_TABLET | Freq: Every day | ORAL | Status: DC

## 2013-04-13 MED ORDER — CLONAZEPAM 0.5 MG PO TABS: 0.5000 mg | ORAL_TABLET | Freq: Two times a day (BID) | ORAL | Status: DC | PRN

## 2013-04-13 MED ORDER — PANTOPRAZOLE SODIUM 40 MG PO TBEC: 40.0000 mg | DELAYED_RELEASE_TABLET | Freq: Two times a day (BID) | ORAL | Status: DC

## 2013-04-13 MED ORDER — ZOLPIDEM TARTRATE 10 MG PO TABS: 10.0000 mg | ORAL_TABLET | Freq: Every evening | ORAL | Status: DC | PRN

## 2013-04-13 MED ORDER — FERROUS SULFATE 325 (65 FE) MG PO TABS: 325.0000 mg | ORAL_TABLET | Freq: Two times a day (BID) | ORAL | Status: DC

## 2013-04-13 NOTE — Progress Notes (Signed)
[facility]:  Office Visit  Chief Complaint:  Chief Complaint  Patient presents with  . Follow-up    GI bleeding    HPI: Jake [name] is a 57 y.o. male who complains of  Here for med refills. 1. GIB-recheck Hgb, supposed to see Dr. Claretha Cooper but has not made appt. He denies any blood in his urine or stool, coughing up any blood, easy bruising.Has been taking all his meds as prescribed including iron and PPI. The last visit he had with Dr. Claretha Cooper 02/07/13 for a second varices banding procedure he was put on a higher dose of BB to help with portal HTN. He takes the Inderal at night. 2. Refill on meds-he ran out of his anxiety meds 3 weeks ago feels, has started drinking again. He admits to self medicating for withdrawal sxs with etoh, has had 3 beers in the last week. Denies abuse of xanax and takes it like it is prescribed but wa worried about withdrawal sxs so he has been drinking beer. 3. Alcohol abuse-has been drinking x 3 weeks, no increase swelling/distansion of stomach, no leg swelling, no bleeding, deneis CP/SOB/tremors  4. He takes [place] to help him sleep. He takes it as prescribed. He has difficulty shutting down his brain, he has been on Xanax for a long time. The last time we talked I had asked him to consider Klonopin    Past Medical History  Diagnosis Date  . Blood transfusion   . Hypertension   . Diabetes mellitus   . Anemia   . Anxiety   . Gout   . Esophageal varices 11/29/12    Historical  . Cirrhosis 11/29/12    historical  . GERD (gastroesophageal reflux disease)    Past Surgical History  Procedure Laterality Date  . Colonoscopy      2011-[facility]. Normal results per patient  . Upper gi endoscopy    . Esophagogastroduodenoscopy N/A 11/29/2012    Procedure: ESOPHAGOGASTRODUODENOSCOPY (EGD);  Surgeon: Florencia Reasons, MD;  Location: [facility];  Service: Endoscopy;  Laterality: N/A;  . Esophagogastroduodenoscopy N/A 01/16/2013    Procedure:  ESOPHAGOGASTRODUODENOSCOPY (EGD);  Surgeon: Petra Kuba, MD;  Location: [facility];  Service: Endoscopy;  Laterality: N/A;   ? bedside case  . Esophageal banding N/A 01/16/2013    Procedure: ESOPHAGEAL BANDING;  Surgeon: Petra Kuba, MD;  Location: [facility];  Service: Endoscopy;  Laterality: N/A;  . Esophagogastroduodenoscopy N/A 02/07/2013    Procedure: ESOPHAGOGASTRODUODENOSCOPY (EGD);  Surgeon: Petra Kuba, MD;  Location: [facility];  Service: Endoscopy;  Laterality: N/A;  . Esophageal banding N/A 02/07/2013    Procedure: ESOPHAGEAL BANDING;  Surgeon: Petra Kuba, MD;  Location: [facility];  Service: Endoscopy;  Laterality: N/A;   History   Social History  . Marital Status: Married    Spouse Name: N/A    Number of Children: N/A  . Years of Education: N/A   Social History Main Topics  . Smoking status: Never Smoker   . Smokeless tobacco: Never Used  . Alcohol Use: 7.2 oz/week    12 Cans of beer per week     Comment: frequent  . Drug Use: No  . Sexually Active: Yes   Other Topics Concern  . None   Social History Narrative  . None   Family History  Problem Relation Age of Onset  . Hyperlipidemia Mother    No Known Allergies Prior to Admission medications   Medication Sig Start Date  End Date Taking? Authorizing Provider  allopurinol (ZYLOPRIM) 300 MG tablet Take 300 mg by mouth daily.   Yes Historical Provider, MD  ALPRAZolam Prudy Feeler) 0.5 MG tablet Take 1 tablet (0.5 mg total) by mouth 3 (three) times daily as needed (sleep/anxiety). 01/18/13  Yes Simonne Martinet, NP  ferrous sulfate 325 (65 FE) MG tablet Take 325 mg by mouth 2 (two) times daily.    Yes Historical Provider, MD  folic acid (FOLVITE) 1 MG tablet Take 1 tablet (1 mg total) by mouth daily. 01/18/13  Yes Simonne Martinet, NP  metoprolol succinate (TOPROL-XL) 25 MG 24 hr tablet Take 1 tablet (25 mg total) by mouth daily. 01/07/13  Yes Vira Chaplin P Auset Fritzler, DO  Multiple Vitamin (MULTIVITAMIN WITH MINERALS) TABS Take 1  tablet by mouth daily.   Yes Historical Provider, MD  pantoprazole (PROTONIX) 40 MG tablet Take 1 tablet (40 mg total) by mouth 2 (two) times daily before a meal. 01/18/13  Yes Simonne Martinet, NP  propranolol (INDERAL) 60 MG tablet Take 60 mg by mouth 3 (three) times daily.   Yes Historical Provider, MD  thiamine 100 MG tablet Take 1 tablet (100 mg total) by mouth daily. 11/30/12  Yes Catarina Hartshorn, MD  levofloxacin (LEVAQUIN) 750 MG tablet Take 1 tablet (750 mg total) by mouth daily. 01/18/13   Simonne Martinet, NP  zolpidem (AMBIEN) 10 MG tablet Take 1 tablet (10 mg total) by mouth at bedtime as needed for sleep. 11/28/12 01/16/13  Gergory Biello P Jonavin Seder, DO     ROS: The patient denies fevers, chills, night sweats, unintentional weight loss, chest pain, palpitations, wheezing, dyspnea on exertion, nausea, vomiting, abdominal pain, dysuria, hematuria, melena, numbness, weakness, or tingling.   All other systems have been reviewed and were otherwise negative with the exception of those mentioned in the HPI and as above.    PHYSICAL EXAM: Filed Vitals:   04/13/13 0901  BP: 100/60  Pulse: 75  Temp: 98 F (36.7 C)  Resp: 16   Filed Vitals:   04/13/13 0901  Height: 5' 8.5" (1.74 m)  Weight: 196 lb (88.905 kg)   Body mass index is 29.36 kg/(m^2).  General: Alert, no acute distress HEENT:  Normocephalic, atraumatic, oropharynx patent. Tired appearing Cardiovascular:  Regular rate and rhythm, no rubs murmurs or gallops.  No Carotid bruits, radial pulse intact. No pedal edema.  Respiratory: Clear to auscultation bilaterally.  No wheezes, rales, or rhonchi.  No cyanosis, no use of accessory musculature GI: No organomegaly, abdomen is soft and non-tender, positive bowel sounds.  No masses. Skin: No rashes. Neurologic: Facial musculature symmetric. Psychiatric: Patient is appropriate throughout our interaction. Lymphatic: No cervical lymphadenopathy Musculoskeletal: Gait intact.   LABS: Results for orders  placed in visit on 04/13/13  POCT CBC      Result Value Range   WBC 3.0 (*) 4.6 - 10.2 K/uL   Lymph, poc 0.8  0.6 - 3.4   POC LYMPH PERCENT 26.9  10 - 50 %L   MID (cbc) 0.4  0 - 0.9   POC MID % 12.3 (*) 0 - 12 %M   POC Granulocyte 1.8 (*) 2 - 6.9   Granulocyte percent 60.8  37 - 80 %G   RBC 4.55 (*) 4.69 - 6.13 M/uL   Hemoglobin 9.4 (*) 14.1 - 18.1 g/dL   HCT, POC 16.1 (*) 09.6 - 53.7 %   MCV 72.3 (*) 80 - 97 fL   MCH, POC 20.7 (*) 27 - 31.2 pg  MCHC 28.6 (*) 31.8 - 35.4 g/dL   RDW, POC 16.1     Platelet Count, POC 94 (*) 142 - 424 K/uL   MPV    0 - 99.8 fL     EKG/XRAY:   Primary read interpreted by Dr[facility] Conley Rolls at [facility].   ASSESSMENT/PLAN: Encounter Diagnoses  Name Primary?  . Generalized anxiety disorder Yes  . History of alcohol use   . Insomnia   . GIB (gastrointestinal bleeding)    Deneis any overt GI bleeding at this time H/o anemia due to GIB complication from etoh abuse Refilled meds Changed his Xanax to Klonopin F/u in 6 months We discussed the fact that he needs to stop drinking completely, he also has to becareful with his meds because he has liver disease which stays inhis sytem longer, he also has to worry about CNS depression with meds. Advise to see Dr. Claretha Cooper, last seen on 02/07/13, he needed to follow-up on 04/09/13 but has not made f/u appt.     Tieara Flitton PHUONG, DO 04/13/2013 10:04 AM

## 2013-04-22 ENCOUNTER — Telehealth: Payer: Self-pay | Admitting: Radiology

## 2013-04-22 ENCOUNTER — Other Ambulatory Visit: Payer: Self-pay | Admitting: Family Medicine

## 2013-04-22 DIAGNOSIS — F411 Generalized anxiety disorder: Secondary | ICD-10-CM

## 2013-04-22 MED ORDER — ALPRAZOLAM 0.5 MG PO TABS: 0.5000 mg | ORAL_TABLET | Freq: Three times a day (TID) | ORAL | Status: DC | PRN

## 2013-04-22 NOTE — Progress Notes (Signed)
He is tired on klonopin even at 1/2  0.5 mg tabs, makes him tired and can't function. Will go ahead and give him a rx for xanax 0.5 mg BID-TID prn, 5 refills. He will turn in his curent klonopin bottle and we will call his pharmacy to cancel his other klonopin refills.

## 2013-04-22 NOTE — Telephone Encounter (Signed)
Rx called in for patient, he is bringing in his remaining Klonopin to exchange for the Alprazolam, cancelled the remaining Klonopin at the pharmacy.

## 2013-09-13 ENCOUNTER — Encounter (HOSPITAL_COMMUNITY): Payer: Self-pay | Admitting: Emergency Medicine

## 2013-09-13 ENCOUNTER — Inpatient Hospital Stay (HOSPITAL_COMMUNITY)
Admission: EM | Admit: 2013-09-13 | Discharge: 2013-09-19 | DRG: 432 | Disposition: A | Discharge status: Home / Self Care | Payer: 59 | Attending: Internal Medicine | Admitting: Internal Medicine

## 2013-09-13 ENCOUNTER — Encounter (HOSPITAL_COMMUNITY)
Admission: EM | Disposition: A | Discharge status: Home / Self Care | Payer: Self-pay | Source: Home / Self Care | Attending: Internal Medicine

## 2013-09-13 ENCOUNTER — Inpatient Hospital Stay (HOSPITAL_COMMUNITY): Payer: 59

## 2013-09-13 DIAGNOSIS — J9600 Acute respiratory failure, unspecified whether with hypoxia or hypercapnia: Secondary | ICD-10-CM | POA: Diagnosis present

## 2013-09-13 DIAGNOSIS — R578 Other shock: Secondary | ICD-10-CM

## 2013-09-13 DIAGNOSIS — D62 Acute posthemorrhagic anemia: Secondary | ICD-10-CM | POA: Diagnosis present

## 2013-09-13 DIAGNOSIS — K219 Gastro-esophageal reflux disease without esophagitis: Secondary | ICD-10-CM | POA: Diagnosis present

## 2013-09-13 DIAGNOSIS — G4700 Insomnia, unspecified: Secondary | ICD-10-CM | POA: Diagnosis present

## 2013-09-13 DIAGNOSIS — D61818 Other pancytopenia: Secondary | ICD-10-CM | POA: Diagnosis present

## 2013-09-13 DIAGNOSIS — K922 Gastrointestinal hemorrhage, unspecified: Secondary | ICD-10-CM | POA: Diagnosis present

## 2013-09-13 DIAGNOSIS — Z5189 Encounter for other specified aftercare: Secondary | ICD-10-CM

## 2013-09-13 DIAGNOSIS — K7030 Alcoholic cirrhosis of liver without ascites: Principal | ICD-10-CM | POA: Diagnosis present

## 2013-09-13 DIAGNOSIS — F1020 Alcohol dependence, uncomplicated: Secondary | ICD-10-CM | POA: Diagnosis present

## 2013-09-13 DIAGNOSIS — I8511 Secondary esophageal varices with bleeding: Secondary | ICD-10-CM | POA: Diagnosis present

## 2013-09-13 DIAGNOSIS — F411 Generalized anxiety disorder: Secondary | ICD-10-CM | POA: Diagnosis present

## 2013-09-13 DIAGNOSIS — IMO0001 Reserved for inherently not codable concepts without codable children: Secondary | ICD-10-CM

## 2013-09-13 HISTORY — PX: ESOPHAGOGASTRODUODENOSCOPY: SHX5428

## 2013-09-13 HISTORY — DX: Reserved for inherently not codable concepts without codable children: IMO0001

## 2013-09-13 HISTORY — DX: Encounter for other specified aftercare: Z51.89

## 2013-09-13 LAB — BLOOD GAS, ARTERIAL
ACID-BASE DEFICIT: 1.1 mmol/L (ref 0.0–2.0)
Bicarbonate: 22.7 mEq/L (ref 20.0–24.0)
DRAWN BY: 331471
FIO2: 1 %
LHR: 18 {breaths}/min
MECHVT: 580 mL
O2 Saturation: 99.1 %
PATIENT TEMPERATURE: 98.6
PEEP/CPAP: 5 cmH2O
TCO2: 20.6 mmol/L (ref 0–100)
pCO2 arterial: 36.8 mmHg (ref 35.0–45.0)
pH, Arterial: 7.408 (ref 7.350–7.450)
pO2, Arterial: 191 mmHg — ABNORMAL HIGH (ref 80.0–100.0)

## 2013-09-13 LAB — CBC WITH DIFFERENTIAL/PLATELET
BASOS ABS: 0 10*3/uL (ref 0.0–0.1)
BASOS PCT: 0 % (ref 0–1)
Basophils Absolute: 0 10*3/uL (ref 0.0–0.1)
Basophils Relative: 0 % (ref 0–1)
EOS ABS: 0.1 10*3/uL (ref 0.0–0.7)
EOS PCT: 2 % (ref 0–5)
Eosinophils Absolute: 0.2 10*3/uL (ref 0.0–0.7)
Eosinophils Relative: 1 % (ref 0–5)
HCT: 39 % (ref 39.0–52.0)
HCT: 31.8 % — ABNORMAL LOW (ref 39.0–52.0)
HEMOGLOBIN: 13.1 g/dL (ref 13.0–17.0)
Hemoglobin: 10.5 g/dL — ABNORMAL LOW (ref 13.0–17.0)
LYMPHS PCT: 13 % (ref 12–46)
Lymphocytes Relative: 8 % — ABNORMAL LOW (ref 12–46)
Lymphs Abs: 1.2 10*3/uL (ref 0.7–4.0)
Lymphs Abs: 1 10*3/uL (ref 0.7–4.0)
MCH: 31.2 pg (ref 26.0–34.0)
MCH: 31.1 pg (ref 26.0–34.0)
MCHC: 33.6 g/dL (ref 30.0–36.0)
MCHC: 33 g/dL (ref 30.0–36.0)
MCV: 92.9 fL (ref 78.0–100.0)
MCV: 94.1 fL (ref 78.0–100.0)
MONO ABS: 1.2 10*3/uL — AB (ref 0.1–1.0)
MONOS PCT: 12 % (ref 3–12)
Monocytes Absolute: 1 10*3/uL (ref 0.1–1.0)
Monocytes Relative: 8 % (ref 3–12)
NEUTROS ABS: 7 10*3/uL (ref 1.7–7.7)
NEUTROS ABS: 10.6 10*3/uL — AB (ref 1.7–7.7)
NEUTROS PCT: 83 % — AB (ref 43–77)
Neutrophils Relative %: 73 % (ref 43–77)
PLATELETS: 116 10*3/uL — AB (ref 150–400)
Platelets: 111 10*3/uL — ABNORMAL LOW (ref 150–400)
RBC: 4.2 MIL/uL — ABNORMAL LOW (ref 4.22–5.81)
RBC: 3.38 MIL/uL — ABNORMAL LOW (ref 4.22–5.81)
RDW: 17.3 % — ABNORMAL HIGH (ref 11.5–15.5)
RDW: 17.5 % — AB (ref 11.5–15.5)
WBC: 9.5 10*3/uL (ref 4.0–10.5)
WBC: 12.8 10*3/uL — ABNORMAL HIGH (ref 4.0–10.5)

## 2013-09-13 LAB — CBC
HCT: 30 % — ABNORMAL LOW (ref 39.0–52.0)
HEMOGLOBIN: 10.2 g/dL — AB (ref 13.0–17.0)
MCH: 31.7 pg (ref 26.0–34.0)
MCHC: 34 g/dL (ref 30.0–36.0)
MCV: 93.2 fL (ref 78.0–100.0)
Platelets: 96 10*3/uL — ABNORMAL LOW (ref 150–400)
RBC: 3.22 MIL/uL — AB (ref 4.22–5.81)
RDW: 17.2 % — ABNORMAL HIGH (ref 11.5–15.5)
WBC: 7.8 10*3/uL (ref 4.0–10.5)

## 2013-09-13 LAB — LIPASE, BLOOD: Lipase: 49 U/L (ref 11–59)

## 2013-09-13 LAB — HEPATIC FUNCTION PANEL
ALT: 44 U/L (ref 0–53)
AST: 65 U/L — ABNORMAL HIGH (ref 0–37)
Albumin: 2.9 g/dL — ABNORMAL LOW (ref 3.5–5.2)
Alkaline Phosphatase: 202 U/L — ABNORMAL HIGH (ref 39–117)
BILIRUBIN DIRECT: 1.4 mg/dL — AB (ref 0.0–0.3)
BILIRUBIN INDIRECT: 1.5 mg/dL — AB (ref 0.3–0.9)
BILIRUBIN TOTAL: 2.9 mg/dL — AB (ref 0.3–1.2)
TOTAL PROTEIN: 5.8 g/dL — AB (ref 6.0–8.3)

## 2013-09-13 LAB — PROTIME-INR
INR: 1.48 (ref 0.00–1.49)
Prothrombin Time: 17.5 seconds — ABNORMAL HIGH (ref 11.6–15.2)

## 2013-09-13 LAB — BASIC METABOLIC PANEL
BUN: 12 mg/dL (ref 6–23)
CALCIUM: 8 mg/dL — AB (ref 8.4–10.5)
CO2: 23 mEq/L (ref 19–32)
CREATININE: 0.53 mg/dL (ref 0.50–1.35)
Chloride: 103 mEq/L (ref 96–112)
GFR calc Af Amer: >90 mL/min (ref >90)
GFR calc non Af Amer: >90 mL/min (ref >90)
GLUCOSE: 193 mg/dL — AB (ref 70–99)
Potassium: 4.6 mEq/L (ref 3.7–5.3)
Sodium: 136 mEq/L — ABNORMAL LOW (ref 137–147)

## 2013-09-13 LAB — PREPARE RBC (CROSSMATCH)

## 2013-09-13 LAB — AMMONIA: AMMONIA: 140 umol/L — AB (ref 11–60)

## 2013-09-13 LAB — LACTIC ACID, PLASMA: LACTIC ACID, VENOUS: 1.5 mmol/L (ref 0.5–2.2)

## 2013-09-13 LAB — ETHANOL

## 2013-09-13 LAB — MRSA PCR SCREENING: MRSA BY PCR: NEGATIVE

## 2013-09-13 SURGERY — EGD (ESOPHAGOGASTRODUODENOSCOPY)
Anesthesia: Moderate Sedation

## 2013-09-13 MED ORDER — SODIUM CHLORIDE 0.9 % IV SOLN
80.0000 mg | Freq: Once | INTRAVENOUS | Status: AC
  Administered 2013-09-13: 80 mg via INTRAVENOUS
  Filled 2013-09-13: qty 80

## 2013-09-13 MED ORDER — MIDAZOLAM HCL 2 MG/2ML IJ SOLN
INTRAMUSCULAR | Status: AC
  Administered 2013-09-13: 2 mg
  Filled 2013-09-13: qty 2

## 2013-09-13 MED ORDER — SODIUM CHLORIDE 0.9 % IV BOLUS (SEPSIS)
1000.0000 mL | Freq: Once | INTRAVENOUS | Status: AC
  Administered 2013-09-13: 1000 mL via INTRAVENOUS

## 2013-09-13 MED ORDER — THIAMINE HCL 100 MG/ML IJ SOLN
100.0000 mg | Freq: Every day | INTRAMUSCULAR | Status: DC
  Administered 2013-09-13 – 2013-09-15 (×3): 100 mg via INTRAVENOUS
  Filled 2013-09-13 (×4): qty 1

## 2013-09-13 MED ORDER — CHLORHEXIDINE GLUCONATE 0.12 % MT SOLN
OROMUCOSAL | Status: AC
  Administered 2013-09-13: 20:00:00 15 mL via OROMUCOSAL
  Filled 2013-09-13: qty 15

## 2013-09-13 MED ORDER — FOLIC ACID 5 MG/ML IJ SOLN
1.0000 mg | Freq: Every day | INTRAMUSCULAR | Status: DC
  Administered 2013-09-13 – 2013-09-15 (×3): 1 mg via INTRAVENOUS
  Filled 2013-09-13 (×6): qty 0.2

## 2013-09-13 MED ORDER — SODIUM CHLORIDE 0.9 % IV SOLN
25.0000 ug/h | INTRAVENOUS | Status: DC
  Filled 2013-09-13: qty 1

## 2013-09-13 MED ORDER — MIDAZOLAM HCL 10 MG/2ML IJ SOLN
INTRAMUSCULAR | Status: AC
  Filled 2013-09-13: qty 2

## 2013-09-13 MED ORDER — CIPROFLOXACIN IN D5W 400 MG/200ML IV SOLN
400.0000 mg | Freq: Two times a day (BID) | INTRAVENOUS | Status: DC
Start: 2013-09-13 — End: 2013-09-19
  Administered 2013-09-13 – 2013-09-19 (×12): 400 mg via INTRAVENOUS
  Filled 2013-09-13 (×13): qty 200

## 2013-09-13 MED ORDER — CHLORHEXIDINE GLUCONATE 0.12 % MT SOLN
15.0000 mL | Freq: Two times a day (BID) | OROMUCOSAL | Status: DC
  Administered 2013-09-13 – 2013-09-19 (×9): 15 mL via OROMUCOSAL
  Filled 2013-09-13 (×12): qty 15

## 2013-09-13 MED ORDER — LIDOCAINE HCL (CARDIAC) 20 MG/ML IV SOLN
INTRAVENOUS | Status: AC
  Filled 2013-09-13: qty 5

## 2013-09-13 MED ORDER — ETOMIDATE 2 MG/ML IV SOLN
INTRAVENOUS | Status: AC
  Administered 2013-09-13: 20 mg
  Filled 2013-09-13: qty 20

## 2013-09-13 MED ORDER — OCTREOTIDE LOAD VIA INFUSION
50.0000 ug | Freq: Once | INTRAVENOUS | Status: AC
  Administered 2013-09-13: 50 ug via INTRAVENOUS
  Filled 2013-09-13: qty 25

## 2013-09-13 MED ORDER — LORAZEPAM 2 MG/ML IJ SOLN
2.0000 mg | INTRAMUSCULAR | Status: DC | PRN
  Filled 2013-09-13: qty 1

## 2013-09-13 MED ORDER — SODIUM CHLORIDE 0.9 % IV SOLN
INTRAVENOUS | Status: DC
  Administered 2013-09-13: 20:00:00 via INTRAVENOUS

## 2013-09-13 MED ORDER — SODIUM CHLORIDE 0.9 % IV SOLN
0.0000 ug/h | INTRAVENOUS | Status: DC
  Administered 2013-09-13: 100 ug/h via INTRAVENOUS
  Administered 2013-09-13: 50 ug/h via INTRAVENOUS
  Filled 2013-09-13: qty 50

## 2013-09-13 MED ORDER — ROCURONIUM BROMIDE 50 MG/5ML IV SOLN
INTRAVENOUS | Status: AC
  Administered 2013-09-13: 10 mg
  Filled 2013-09-13: qty 2

## 2013-09-13 MED ORDER — BIOTENE DRY MOUTH MT LIQD
15.0000 mL | Freq: Two times a day (BID) | OROMUCOSAL | Status: DC
  Administered 2013-09-13 – 2013-09-19 (×11): 15 mL via OROMUCOSAL

## 2013-09-13 MED ORDER — FENTANYL CITRATE 0.05 MG/ML IJ SOLN: 50.0000 ug | Freq: Once | INTRAMUSCULAR | Status: DC

## 2013-09-13 MED ORDER — CEFTRIAXONE SODIUM 1 G IJ SOLR: 1.0000 g | Freq: Once | INTRAMUSCULAR | Status: DC

## 2013-09-13 MED ORDER — SODIUM CHLORIDE 0.9 % IV SOLN
25.0000 ug/h | INTRAVENOUS | Status: DC
  Administered 2013-09-13 – 2013-09-14 (×4): 50 ug/h via INTRAVENOUS
  Administered 2013-09-15 – 2013-09-17 (×3): 25 ug/h via INTRAVENOUS
  Filled 2013-09-13 (×17): qty 1

## 2013-09-13 MED ORDER — SODIUM CHLORIDE 0.9 % IV SOLN
INTRAVENOUS | Status: DC
  Administered 2013-09-13 – 2013-09-14 (×2): via INTRAVENOUS

## 2013-09-13 MED ORDER — FENTANYL CITRATE 0.05 MG/ML IJ SOLN
INTRAMUSCULAR | Status: AC
  Administered 2013-09-13: 100 ug
  Filled 2013-09-13: qty 2

## 2013-09-13 MED ORDER — SUCCINYLCHOLINE CHLORIDE 20 MG/ML IJ SOLN
INTRAMUSCULAR | Status: AC
  Filled 2013-09-13: qty 1

## 2013-09-13 MED ORDER — SODIUM CHLORIDE 0.9 % IV SOLN: 250.0000 mL | INTRAVENOUS | Status: DC | PRN

## 2013-09-13 MED ORDER — FENTANYL BOLUS VIA INFUSION
50.0000 ug | INTRAVENOUS | Status: DC | PRN
  Administered 2013-09-14: 25 ug via INTRAVENOUS
  Filled 2013-09-13: qty 100

## 2013-09-13 MED ORDER — ONDANSETRON HCL 4 MG/2ML IJ SOLN
4.0000 mg | Freq: Once | INTRAMUSCULAR | Status: AC
  Administered 2013-09-13: 4 mg via INTRAVENOUS
  Filled 2013-09-13: qty 2

## 2013-09-13 NOTE — Progress Notes (Signed)
ANTIBIOTIC CONSULT NOTE - INITIAL  Pharmacy Consult for Cipro and may adjust abx for renal function Indication: possible SBP  No Known Allergies  Patient Measurements:   Adjusted Body Weight:   Vital Signs: BP: 122/63 mmHg (01/02 1845) Pulse Rate: 63 (01/02 1845) Intake/Output from previous day:   Intake/Output from this shift:    Labs:  Recent Labs  09/13/13 1350 09/13/13 1410 09/13/13 1642  WBC 9.5  --  12.8*  HGB 13.1  --  10.5*  PLT 111*  --  116*  CREATININE  --  0.53  --    The CrCl is unknown because both a height and weight (above a minimum accepted value) are required for this calculation. No results found for this basename: VANCOTROUGH, VANCOPEAK, VANCORANDOM, GENTTROUGH, GENTPEAK, GENTRANDOM, TOBRATROUGH, TOBRAPEAK, TOBRARND, AMIKACINPEAK, AMIKACINTROU, AMIKACIN,  in the last 72 hours   Microbiology: No results found for this or any previous visit (from the past 720 hour(s)).  Medical History: Past Medical History  Diagnosis Date  . Blood transfusion   . Hypertension   . Anemia   . Anxiety   . Gout   . Esophageal varices 11/29/12    Historical  . Cirrhosis 11/29/12    historical  . GERD (gastroesophageal reflux disease)     Medications:  Prescriptions prior to admission  Medication Sig Dispense Refill  . ALPRAZolam (XANAX) 0.5 MG tablet Take 1 tablet (0.5 mg total) by mouth 3 (three) times daily as needed for sleep.  60 tablet  5  . ferrous sulfate 325 (65 FE) MG tablet Take 1 tablet (325 mg total) by mouth 2 (two) times daily.  60 tablet  5  . folic acid (FOLVITE) 1 MG tablet Take 1 tablet (1 mg total) by mouth daily.  30 tablet  5  . Multiple Vitamin (MULTIVITAMIN WITH MINERALS) TABS Take 1 tablet by mouth daily.      . pantoprazole (PROTONIX) 40 MG tablet Take 1 tablet (40 mg total) by mouth 2 (two) times daily before a meal.  60 tablet  5  . propranolol ER (INDERAL LA) 60 MG 24 hr capsule Take 1 capsule (60 mg total) by mouth daily.  30 capsule   5  . thiamine 100 MG tablet Take 1 tablet (100 mg total) by mouth daily.  30 tablet  5  . zolpidem (AMBIEN) 10 MG tablet Take 10 mg by mouth at bedtime as needed for sleep.       Scheduled:  . cefTRIAXone (ROCEPHIN)  IV  1 g Intravenous Once  . ciprofloxacin  400 mg Intravenous Q12H  . fentaNYL  50 mcg Intravenous Once  . folic acid  1 mg Intravenous Daily  . lidocaine (cardiac) 100 mg/72ml      . succinylcholine      . thiamine  100 mg Intravenous Daily   Assessment: 58yo M with EtOH abuse and cirrhosis presented with massive hematemesis. Intubated for protection of airway. Underwent EGD and varices were banded. Pharmacy is asked to dose Cipro for possible SBP and also follow with CCM and adjust any other abx therapy as needed. SCr is wnl, CrCl >153ml/min.  Goal of Therapy:  Appropriate dosing for weight and renal function  Plan:   Start Cipro 400mg  IV q12h.  Pharmacy will dc the protocol and follow from a distance with CCM.  Romeo Rabon, PharmD, pager [phone]. 09/13/2013,7:40 PM.

## 2013-09-13 NOTE — ED Notes (Signed)
Per Dr. Elsworth Soho, no OG at this time because pt has to be scoped

## 2013-09-13 NOTE — Op Note (Signed)
[facility] [place], [id number]   ENDOSCOPY PROCEDURE REPORT  PATIENT: Jake, [name]  MR#: [id number] BIRTHDATE: Jan 18, 1956 , 63  yrs. old GENDER: Male ENDOSCOPIST:Jostin Rue Amedeo Plenty, MD REFERRED BY: PROCEDURE DATE:  09/13/2013 PROCEDURE: ASA CLASS: INDICATIONS:   massive upper GI bleeding MEDICATION:   fentanyl drip per general anesthesia, patient orally intubated TOPICAL ANESTHETIC:  DESCRIPTION OF PROCEDURE:   large distal esophageal varices, one of reflux of blood obscuring visualization making it very difficult to visualize bleeding source. Below the GE junction there is largely blood clot occupying the entire visualized stomach. This was very hard to clear out and I could not follow the course of the lesser curvature down to the duodenum which was not examined. Did see fairly large distal esophageal varices and due to the poor visualization elected to withdraw the scope placed the banding apparatus and reintubate to the distal esophagus. Under partial visualization I was able to place 5 bands on distal esophageal varices and 2 other attempted band placements misfired. It is not clear whether this affected any cessation of bleeding although it seemed to possibly have slowed down. Despite the massive bleeding the patient's vital signs remained stable.     COMPLICATIONS: None  ENDOSCOPIC IMPRESSION:large esophageal varices with active bleeding could not ascertain whether from distal esophagus or stomach. Stomach poorly examined due to blood in duodenum not examined.  RECOMMENDATIONS:IV octreotide, intensive care therapy, transfusion as necessary, correct coagulopathy, IV antibiotics, consider repeat EGD in a day or 2. Also consider TI PS if bleeding does not cease or becomes life-threatening    _______________________________ eSignedTeena Irani, MD 09/13/2013 5:53 PM

## 2013-09-13 NOTE — Progress Notes (Signed)
Utilization Review completed.  Maurice Fotheringham RN CM  

## 2013-09-13 NOTE — H&P (Signed)
Name: Jake [name] MRN: [id number] DOB: 03/24/1956    ADMISSION DATE:  09/13/2013  REFERRING MD :  ED, GI Amedeo Plenty PRIMARY SERVICE: PCCM  CHIEF COMPLAINT:  hematemesis  BRIEF PATIENT DESCRIPTION: 58 y.o active ETOH user with ETOH cirrhosis , known varices, presented with massive UGIB, intubated in ED to enable safe EGD  SIGNIFICANT EVENTS / STUDIES:  EGD 1/2 >>  LINES / TUBES: ETT 1/2 RIJ 1/2 >>  CULTURES:   ANTIBIOTICS: cipro 1/2 (proph)  HISTORY OF PRESENT ILLNESS:  58 y.o active ETOH user with ETOH cirrhosis , known varices, presented with massive UGIB, intubated in ED to enable safe EGD. He presented with sudden onset vomiting blood approx 30 mins PTA. Pt has hx of esophageal varices-banded by Dr Watt Climes. Pt denies any ETOH today -last drink was on 1/1. Pt spouse reports that pt has been drinking approx 12 beers/day over the holidays. PCCM asked by dr Amedeo Plenty to intubate for safe EGD, since last food intake was around noon   PAST MEDICAL HISTORY :  Past Medical History  Diagnosis Date  . Blood transfusion   . Hypertension   . Anemia   . Anxiety   . Gout   . Esophageal varices 11/29/12    Historical  . Cirrhosis 11/29/12    historical  . GERD (gastroesophageal reflux disease)    Past Surgical History  Procedure Laterality Date  . Colonoscopy      2011-[facility]. Normal results per patient  . Upper gi endoscopy    . Esophagogastroduodenoscopy N/A 11/29/2012    Procedure: ESOPHAGOGASTRODUODENOSCOPY (EGD);  Surgeon: Cleotis Nipper, MD;  Location: [facility];  Service: Endoscopy;  Laterality: N/A;  . Esophagogastroduodenoscopy N/A 01/16/2013    Procedure: ESOPHAGOGASTRODUODENOSCOPY (EGD);  Surgeon: Jeryl Columbia, MD;  Location: [facility];  Service: Endoscopy;  Laterality: N/A;   ? bedside case  . Esophageal banding N/A 01/16/2013    Procedure: ESOPHAGEAL BANDING;  Surgeon: Jeryl Columbia, MD;  Location: [facility];  Service: Endoscopy;  Laterality: N/A;  .  Esophagogastroduodenoscopy N/A 02/07/2013    Procedure: ESOPHAGOGASTRODUODENOSCOPY (EGD);  Surgeon: Jeryl Columbia, MD;  Location: [facility];  Service: Endoscopy;  Laterality: N/A;  . Esophageal banding N/A 02/07/2013    Procedure: ESOPHAGEAL BANDING;  Surgeon: Jeryl Columbia, MD;  Location: [facility];  Service: Endoscopy;  Laterality: N/A;   Prior to Admission medications   Medication Sig Start Date End Date Taking? Authorizing Provider  ALPRAZolam Duanne Moron) 0.5 MG tablet Take 1 tablet (0.5 mg total) by mouth 3 (three) times daily as needed for sleep. 04/22/13  Yes Thao P Le, DO  ferrous sulfate 325 (65 FE) MG tablet Take 1 tablet (325 mg total) by mouth 2 (two) times daily. 04/13/13  Yes Thao P Le, DO  folic acid (FOLVITE) 1 MG tablet Take 1 tablet (1 mg total) by mouth daily. 04/13/13  Yes Thao P Le, DO  Multiple Vitamin (MULTIVITAMIN WITH MINERALS) TABS Take 1 tablet by mouth daily.   Yes Historical Provider, MD  pantoprazole (PROTONIX) 40 MG tablet Take 1 tablet (40 mg total) by mouth 2 (two) times daily before a meal. 04/13/13  Yes Thao P Le, DO  propranolol ER (INDERAL LA) 60 MG 24 hr capsule Take 1 capsule (60 mg total) by mouth daily. 04/13/13  Yes Thao P Le, DO  thiamine 100 MG tablet Take 1 tablet (100 mg total) by mouth daily. 04/13/13  Yes Thao P Le, DO  zolpidem (AMBIEN) 10  MG tablet Take 10 mg by mouth at bedtime as needed for sleep.   Yes Historical Provider, MD   No Known Allergies  FAMILY HISTORY:  Family History  Problem Relation Age of Onset  . Hyperlipidemia Mother    SOCIAL HISTORY:  reports that he has never smoked. He has never used smokeless tobacco. He reports that he drinks about 7.2 ounces of alcohol per week. He reports that he does not use illicit drugs.  REVIEW OF SYSTEMS:  As above  Constitutional: negative for anorexia, fevers and sweats  Eyes: negative for irritation, redness and visual disturbance  Ears, nose, mouth, throat, and face: negative for earaches,  epistaxis, nasal congestion and sore throat  Respiratory: negative for cough, dyspnea on exertion, sputum and wheezing  Cardiovascular: negative for chest pain, dyspnea, lower extremity edema, orthopnea, palpitations and syncope  Gastrointestinal: negative for abdominal pain, constipation, diarrhea Genitourinary:negative for dysuria, frequency and hematuria  Hematologic/lymphatic: negative for bleeding, easy bruising and lymphadenopathy  Musculoskeletal:negative for arthralgias, muscle weakness and stiff joints  Neurological: negative for coordination problems, gait problems, headaches and weakness  Endocrine: negative for diabetic symptoms including polydipsia, polyuria and weight loss   SUBJECTIVE:   VITAL SIGNS: Pulse Rate:  [67-82] 73 (01/02 1500) Resp:  [19-22] 21 (01/02 1500) BP: (82-102)/(51-64) 86/51 mmHg (01/02 1500) SpO2:  [93 %-98 %] 94 % (01/02 1500) FiO2 (%):  [100 %] 100 % (01/02 1624) HEMODYNAMICS:   VENTILATOR SETTINGS: Vent Mode:  [-] PRVC FiO2 (%):  [100 %] 100 % Set Rate:  [18 bmp] 18 bmp Vt Set:  [580 mL] 580 mL PEEP:  [5 cmH20] 5 cmH20 Plateau Pressure:  [20 cmH20] 20 cmH20 INTAKE / OUTPUT: Intake/Output     01/01 0701 - 01/02 0700 01/02 0701 - 01/03 0700   Emesis/NG output  1000   Total Output   1000   Net   -1000          PHYSICAL EXAMINATION: Gen. Pleasant, well-nourished, in no distress, anxious affect ENT - no lesions, no post nasal drip, no icterus Neck: No JVD, no thyromegaly, no carotid bruits Lungs: no use of accessory muscles, no dullness to percussion, clear without rales or rhonchi  Cardiovascular: Rhythm regular, heart sounds  normal, no murmurs, no peripheral edema Abdomen: soft and non-tender, no hepatosplenomegaly, BS normal. Musculoskeletal: No deformities, no cyanosis or clubbing Neuro:  alert, non focal,no flaps Skin:  Warm, no lesions/ rash   LABS:  CBC  Recent Labs Lab 09/13/13 1350 09/13/13 1642  WBC 9.5 12.8*  HGB  13.1 10.5*  HCT 39.0 31.8*  PLT 111* 116*   Coag's  Recent Labs Lab 09/13/13 1410  INR 1.48   BMET  Recent Labs Lab 09/13/13 1410  NA 136*  K 4.6  CL 103  CO2 23  BUN 12  CREATININE 0.53  GLUCOSE 193*   Electrolytes  Recent Labs Lab 09/13/13 1410  CALCIUM 8.0*   Sepsis Markers  Recent Labs Lab 09/13/13 1350  LATICACIDVEN 1.5   ABG No results found for this basename: PHART, PCO2ART, PO2ART,  in the last 168 hours Liver Enzymes  Recent Labs Lab 09/13/13 1410  AST 65*  ALT 44  ALKPHOS 202*  BILITOT 2.9*  ALBUMIN 2.9*   Cardiac Enzymes No results found for this basename: TROPONINI, PROBNP,  in the last 168 hours Glucose No results found for this basename: GLUCAP,  in the last 168 hours  Imaging No results found.   CXR: ETT/ RIJ in position  ASSESSMENT / PLAN:  PULMONARY A:[place] resp failure, intubated for airway protection durign EGD P:   Extubate once bleeding stopped  CARDIOVASCULAR A: Hypovolemic/ hemorrhagic shock P:  Transfuse/ fluids  RENAL A:  At risk AKI P:   Follow BMET  GASTROINTESTINAL A:  UGIB, likely varicela ETOH cirrhosis P:   EGD  Octreotide gtt Protonix q 12h  HEMATOLOGIC A:  Anemia - acute blood loss Thrombocytopenia -due to ETOH P:  Transfuse if Hb < 10 & in shock, otherwise <7  INFECTIOUS A:  No issues P:   Cipro for SBP proph  ENDOCRINE A:  No issues   P:   CBgs while npo  NEUROLOGIC A:  At risk ETOH withdrawal & hepatic enceph, ammonia 140 P:   CIWA - ativan prn Fent gtt Assess need for lactulose in 24h  TODAY'S SUMMARY: Likely variceal bleed, intubated for airway protection, hope to extubate in am if no complicating factors. Updated wife  I have personally obtained a history, examined the patient, evaluated laboratory and imaging results, formulated the assessment and plan and placed orders. CRITICAL CARE: The patient is critically ill with multiple organ systems failure and requires  high complexity decision making for assessment and support, frequent evaluation and titration of therapies, application of advanced monitoring technologies and extensive interpretation of multiple databases. Critical Care Time devoted to patient care services described in this note is 60 minutes.   Rigoberto Noel Pul[facility] and [place] Pager: [phone]  09/13/2013, 5:03 PM

## 2013-09-13 NOTE — ED Provider Notes (Signed)
CSN: [id number]     Arrival date & time 09/13/13  1352 History   First MD Initiated Contact with Patient 09/13/13 1355     No chief complaint on file.  (Consider location/radiation/quality/duration/timing/severity/associated sxs/prior Treatment) Patient is a 58 y.o. male presenting with GI illness.  GI Problem This is a recurrent problem. The current episode started 1 to 2 hours ago. The problem occurs constantly. The problem has been rapidly worsening. Associated symptoms include abdominal pain. Pertinent negatives include no chest pain and no shortness of breath. Nothing aggravates the symptoms. Nothing relieves the symptoms. He has tried nothing for the symptoms.    Past Medical History  Diagnosis Date  . Blood transfusion   . Hypertension   . Diabetes mellitus   . Anemia   . Anxiety   . Gout   . Esophageal varices 11/29/12    Historical  . Cirrhosis 11/29/12    historical  . GERD (gastroesophageal reflux disease)    Past Surgical History  Procedure Laterality Date  . Colonoscopy      2011-[facility]. Normal results per patient  . Upper gi endoscopy    . Esophagogastroduodenoscopy N/A 11/29/2012    Procedure: ESOPHAGOGASTRODUODENOSCOPY (EGD);  Surgeon: Cleotis Nipper, MD;  Location: [facility];  Service: Endoscopy;  Laterality: N/A;  . Esophagogastroduodenoscopy N/A 01/16/2013    Procedure: ESOPHAGOGASTRODUODENOSCOPY (EGD);  Surgeon: Jeryl Columbia, MD;  Location: [facility];  Service: Endoscopy;  Laterality: N/A;   ? bedside case  . Esophageal banding N/A 01/16/2013    Procedure: ESOPHAGEAL BANDING;  Surgeon: Jeryl Columbia, MD;  Location: [facility];  Service: Endoscopy;  Laterality: N/A;  . Esophagogastroduodenoscopy N/A 02/07/2013    Procedure: ESOPHAGOGASTRODUODENOSCOPY (EGD);  Surgeon: Jeryl Columbia, MD;  Location: [facility];  Service: Endoscopy;  Laterality: N/A;  . Esophageal banding N/A 02/07/2013    Procedure: ESOPHAGEAL BANDING;  Surgeon: Jeryl Columbia, MD;  Location: [facility];  Service: Endoscopy;  Laterality: N/A;   Family History  Problem Relation Age of Onset  . Hyperlipidemia Mother    History  Substance Use Topics  . Smoking status: Never Smoker   . Smokeless tobacco: Never Used  . Alcohol Use: 7.2 oz/week    12 Cans of beer per week     Comment: frequent    Review of Systems  Constitutional: Negative for fever.  Respiratory: Negative for cough and shortness of breath.   Cardiovascular: Negative for chest pain.  Gastrointestinal: Positive for vomiting and abdominal pain.  All other systems reviewed and are negative.    Allergies  Review of patient's allergies indicates no known allergies.  Home Medications   Current Outpatient Rx  Name  Route  Sig  Dispense  Refill  . ALPRAZolam (XANAX) 0.5 MG tablet   Oral   Take 1 tablet (0.5 mg total) by mouth 3 (three) times daily as needed for sleep.   60 tablet   5   . ferrous sulfate 325 (65 FE) MG tablet   Oral   Take 1 tablet (325 mg total) by mouth 2 (two) times daily.   60 tablet   5   . folic acid (FOLVITE) 1 MG tablet   Oral   Take 1 tablet (1 mg total) by mouth daily.   30 tablet   5   . Multiple Vitamin (MULTIVITAMIN WITH MINERALS) TABS   Oral   Take 1 tablet by mouth daily.         . pantoprazole (PROTONIX) 40 MG  tablet   Oral   Take 1 tablet (40 mg total) by mouth 2 (two) times daily before a meal.   60 tablet   5   . propranolol ER (INDERAL LA) 60 MG 24 hr capsule   Oral   Take 1 capsule (60 mg total) by mouth daily.   30 capsule   5   . thiamine 100 MG tablet   Oral   Take 1 tablet (100 mg total) by mouth daily.   30 tablet   5   . EXPIRED: zolpidem (AMBIEN) 10 MG tablet   Oral   Take 1 tablet (10 mg total) by mouth at bedtime as needed for sleep.   30 tablet   5    There were no vitals taken for this visit. Physical Exam  Nursing note and vitals reviewed. Constitutional: He is oriented to person, place, and time. He appears well-developed  and well-nourished. He appears distressed.  Ill appearing  HENT:  Head: Normocephalic and atraumatic.  Mouth/Throat: Oropharynx is clear and moist.  Bright red blood covering chin  Eyes: Conjunctivae are normal. Pupils are equal, round, and reactive to light. No scleral icterus.  Neck: Neck supple.  Cardiovascular: Normal rate, regular rhythm, normal heart sounds and intact distal pulses.   No murmur heard. Pulmonary/Chest: Effort normal and breath sounds normal. No stridor. No respiratory distress. He has no wheezes. He has no rales.  Abdominal: Soft. He exhibits no distension. There is tenderness in the epigastric area. There is no rigidity and no rebound.  Musculoskeletal: Normal range of motion. He exhibits no edema.  Neurological: He is alert and oriented to person, place, and time.  Skin: Skin is warm. No rash noted. He is diaphoretic.  Psychiatric: He has a normal mood and affect. His behavior is normal.    ED Course  CRITICAL CARE Performed by: Serita Grit DAVID Authorized by: Serita Grit DAVID Total critical care time: 35 minutes Critical care time was exclusive of separately billable procedures and treating other patients. Critical care was necessary to treat or prevent imminent or life-threatening deterioration of the following conditions: circulatory failure and shock. Critical care was time spent personally by me on the following activities: development of treatment plan with patient or surrogate, discussions with consultants, evaluation of patient's response to treatment, examination of patient, obtaining history from patient or surrogate, ordering and performing treatments and interventions, ordering and review of laboratory studies, ordering and review of radiographic studies, pulse oximetry, re-evaluation of patient's condition and review of old charts.   (including critical care time) Labs Review Labs Reviewed  AMMONIA - Abnormal; Notable for the following:     Ammonia 140 (*)    All other components within normal limits  BASIC METABOLIC PANEL - Abnormal; Notable for the following:    Sodium 136 (*)    Glucose, Bld 193 (*)    Calcium 8.0 (*)    All other components within normal limits  HEPATIC FUNCTION PANEL - Abnormal; Notable for the following:    Total Protein 5.8 (*)    Albumin 2.9 (*)    AST 65 (*)    Alkaline Phosphatase 202 (*)    Total Bilirubin 2.9 (*)    Bilirubin, Direct 1.4 (*)    Indirect Bilirubin 1.5 (*)    All other components within normal limits  PROTIME-INR - Abnormal; Notable for the following:    Prothrombin Time 17.5 (*)    All other components within normal limits  CBC WITH DIFFERENTIAL -  Abnormal; Notable for the following:    RBC 4.20 (*)    RDW 17.3 (*)    Platelets 111 (*)    Monocytes Absolute 1.2 (*)    All other components within normal limits  LACTIC ACID, PLASMA  LIPASE, BLOOD  ETHANOL  TYPE AND SCREEN  PREPARE RBC (CROSSMATCH)   Imaging Review No results found.  EKG Interpretation    Date/Time:  Friday September 13 2013 13:59:29 EST Ventricular Rate:  73 PR Interval:  128 QRS Duration: 97 QT Interval:  408 QTC Calculation: 450 R Axis:   66 Text Interpretation:  Sinus rhythm No significant change was found Confirmed by [facility]  MD, TREY ([id number]) on 09/13/2013 4:27:54 PM            MDM   1. Acute upper GI bleed   2. Liver cirrhosis, alcoholic    0:98 PM 58 yo male with hx of varices presenting with hematemesis.  Ill appearing, diaphoretic, and pale.  Plan 2 large bore IV, initial crystalloid resuscitation, ocreotide bolus/drip, protonix, labwork.  Have already discussed likelihood of needing blood products with pt and wife, who gave consent.  Have discussed case with Dr. Amedeo Plenty, [facility], who will evaluate the patient as well.    4:24 PM Pt's mentation improved somewhat with crystalloid, but blood pressures remained poor.  Therefore, have ordered transfusion of 1 unit pRBCs.  GI plans to  scope.  Critical Care consulted and plans intubation for procedure and admission to ICU.    Houston Siren, MD 09/13/13 [phone]

## 2013-09-13 NOTE — Procedures (Signed)
Central Venous Catheter Insertion Procedure Note KHADAR MONGER [id number] [birth year]  Procedure: Insertion of Central Venous Catheter Indications: Drug and/or fluid administration and Frequent blood sampling  Procedure Details Consent: Risks of procedure as well as the alternatives and risks of each were explained to the (patient/caregiver).  Consent for procedure obtained. Time Out: Verified patient identification, verified procedure, site/side was marked, verified correct patient position, special equipment/implants available, medications/allergies/relevent history reviewed, required imaging and test results available.  Performed  Maximum sterile technique was used including antiseptics, cap, gloves, gown, hand hygiene, mask and sheet. Skin prep: Chlorhexidine; local anesthetic administered A antimicrobial bonded/coated triple lumen catheter was placed in the right internal jugular vein using the Seldinger technique.  Evaluation Blood flow good Complications: No apparent complications Patient did tolerate procedure well. Chest X-ray ordered to verify placement.  CXR: pending.  ALVA,RAKESH V. 09/13/2013, 4:57 PM

## 2013-09-13 NOTE — Progress Notes (Signed)
Rt transported pt to ICU. Pt vitals stable.

## 2013-09-13 NOTE — ED Notes (Signed)
Pt central line placed by Dr Elsworth Soho after RSI

## 2013-09-13 NOTE — Progress Notes (Signed)
Addendum: EGD performed at bedside in the emergency room after patient intubated. Patient having massive upper GI bleeding of obscure vision especially in the stomach where clots and blood essentially in secured visualization of the entire stomach. I was unable to reach the pylorus and into the duodenum. The only location that could be intermittently well visualized as the distal esophagus and there were large varices there. I never saw a specific bleeding site but there is bright red blood in the vicinity. The scope was withdrawn and the banding apparatus attached and the scope reintroduced to the GE junction. Beginning about 40 cm I began placing bands on esophageal varices. A total of 5 bands were placed successfully. 2 misfired. I was never able to get good visualization of the stomach that was largely filled with blood.  Impression massive upper GI bleeding probably from esophageal plus minus gastric varices.  Plan: ICU supportive care blood and fluid replacement, octreotide proton pump inhibitor and consideration of TI PS if bleeding continues.

## 2013-09-13 NOTE — ED Notes (Addendum)
Pt from home vomiting blood approx 30 mins PTA. Pt has hx of esophageal varices. Pt is A&O and in NAD. Pt denies any ETOH  today. Pt spouse reports that pt has been drinking approx 12 beers/day over the holidays

## 2013-09-13 NOTE — Consult Note (Addendum)
[facility] Consult Note  Referring Provider: No ref. provider found Primary Care Physician:  Leotis Pain, DO Primary Gastroenterologist:  Dr.  Laurel Dimmer Complaint: Vomiting blood HPI: Jake [name] is an 58 y.o. white male  with history of alcoholic cirrhosis and bleeding esophageal varices who presents to the emergency room today with abdominal pain followed by copious hematemesis with more than 1 L of bright red blood witnessed to be vomited up since he has been in the emergency room. He has had a low blood pressure in the low 80s and was mentating poorly when he first arrived. His initial hemoglobin is 13. His blood pressure is up to 9 days after having been given some IV fluids.His last known GI bleed with endoscopy and banding of esophageal varices was in May 2014  Past Medical History  Diagnosis Date  . Blood transfusion   . Hypertension   . Anemia   . Anxiety   . Gout   . Esophageal varices 11/29/12    Historical  . Cirrhosis 11/29/12    historical  . GERD (gastroesophageal reflux disease)     Past Surgical History  Procedure Laterality Date  . Colonoscopy      2011-[facility]. Normal results per patient  . Upper gi endoscopy    . Esophagogastroduodenoscopy N/A 11/29/2012    Procedure: ESOPHAGOGASTRODUODENOSCOPY (EGD);  Surgeon: Cleotis Nipper, MD;  Location: [facility];  Service: Endoscopy;  Laterality: N/A;  . Esophagogastroduodenoscopy N/A 01/16/2013    Procedure: ESOPHAGOGASTRODUODENOSCOPY (EGD);  Surgeon: Jeryl Columbia, MD;  Location: [facility];  Service: Endoscopy;  Laterality: N/A;   ? bedside case  . Esophageal banding N/A 01/16/2013    Procedure: ESOPHAGEAL BANDING;  Surgeon: Jeryl Columbia, MD;  Location: [facility];  Service: Endoscopy;  Laterality: N/A;  . Esophagogastroduodenoscopy N/A 02/07/2013    Procedure: ESOPHAGOGASTRODUODENOSCOPY (EGD);  Surgeon: Jeryl Columbia, MD;  Location: [facility];  Service: Endoscopy;  Laterality: N/A;  . Esophageal  banding N/A 02/07/2013    Procedure: ESOPHAGEAL BANDING;  Surgeon: Jeryl Columbia, MD;  Location: [facility];  Service: Endoscopy;  Laterality: N/A;     (Not in a hospital admission)  Allergies: No Known Allergies  Family History  Problem Relation Age of Onset  . Hyperlipidemia Mother     Social History:  reports that he has never smoked. He has never used smokeless tobacco. He reports that he drinks about 7.2 ounces of alcohol per week. He reports that he does not use illicit drugs.  Review of Systems: negative except not easily obtainable currently   Blood pressure 82/56, pulse 71, resp. rate 20, SpO2 93.00%. Head: Normocephalic, without obvious abnormality, atraumatic Neck: no adenopathy, no carotid bruit, no JVD, supple, symmetrical, trachea midline and thyroid not enlarged, symmetric, no tenderness/mass/nodules Resp: clear to auscultation bilaterally Cardio: regular rate and rhythm, S1, S2 normal, no murmur, click, rub or gallop GI: Abdomen soft slightly distended with normoactive bowel sounds. No hepatosplenomegaly mass or guarding Extremities: extremities normal, atraumatic, no cyanosis or edema  Results for orders placed during the hospital encounter of 09/13/13 (from the past 48 hour(s))  LACTIC ACID, PLASMA     Status: None   Collection Time    09/13/13  1:50 PM      Result Value Range   Lactic Acid, Venous 1.5  0.5 - 2.2 mmol/L  TYPE AND SCREEN     Status: None   Collection Time    09/13/13  1:50 PM  Result Value Range   ABO/RH(D) A NEG     Antibody Screen NEG     Sample Expiration 09/16/2013    CBC WITH DIFFERENTIAL     Status: Abnormal (Preliminary result)   Collection Time    09/13/13  1:50 PM      Result Value Range   WBC 9.5  4.0 - 10.5 K/uL   RBC 4.20 (*) 4.22 - 5.81 MIL/uL   Hemoglobin 13.1  13.0 - 17.0 g/dL   HCT 39.0  39.0 - 52.0 %   MCV 92.9  78.0 - 100.0 fL   MCH 31.2  26.0 - 34.0 pg   MCHC 33.6  30.0 - 36.0 g/dL   RDW 17.3 (*) 11.5 - 15.5 %    Platelets PENDING  150 - 400 K/uL   Neutrophils Relative % 73  43 - 77 %   Neutro Abs 7.0  1.7 - 7.7 K/uL   Lymphocytes Relative 13  12 - 46 %   Lymphs Abs 1.2  0.7 - 4.0 K/uL   Monocytes Relative 12  3 - 12 %   Monocytes Absolute 1.2 (*) 0.1 - 1.0 K/uL   Eosinophils Relative 2  0 - 5 %   Eosinophils Absolute 0.2  0.0 - 0.7 K/uL   Basophils Relative 0  0 - 1 %   Basophils Absolute 0.0  0.0 - 0.1 K/uL  PROTIME-INR     Status: Abnormal   Collection Time    09/13/13  2:10 PM      Result Value Range   Prothrombin Time 17.5 (*) 11.6 - 15.2 seconds   INR 1.48  0.00 - 1.49   No results found.  Assessment: Pilar Plate hematemesis with hypotension likely due to recurrent bleeding esophageal varices Plan:  Admit to the ICU. Fluids and blood as needed Start octreotide. We'll proceed with endoscopy and will request patient be electively intubated to protect airway given the copious hematemesis. SJGGE,ZMOQ C 09/13/2013, 2:59 PM

## 2013-09-13 NOTE — Procedures (Signed)
Intubation Procedure Note Jake [name] [id number] July 27, 1956  Procedure: Intubation Indications: Airway protection and maintenance  Procedure Details Consent: Risks of procedure as well as the alternatives and risks of each were explained to the (patient/caregiver).  Consent for procedure obtained. Time Out: Verified patient identification, verified procedure, site/side was marked, verified correct patient position, special equipment/implants available, medications/allergies/relevent history reviewed, required imaging and test results available.  Performed  Maximum sterile technique was used including antiseptics, cap, gloves, gown, hand hygiene and mask.  MAC and 4    Evaluation Hemodynamic Status: Transient hypotension treated with fluid; O2 sats: stable throughout Patient's Current Condition: stable Complications: No apparent complications Patient did tolerate procedure well. Chest X-ray ordered to verify placement.  CXR: pending.   Jake [name] V. 09/13/2013

## 2013-09-14 ENCOUNTER — Inpatient Hospital Stay (HOSPITAL_COMMUNITY): Payer: 59

## 2013-09-14 DIAGNOSIS — K7030 Alcoholic cirrhosis of liver without ascites: Principal | ICD-10-CM

## 2013-09-14 DIAGNOSIS — R578 Other shock: Secondary | ICD-10-CM

## 2013-09-14 DIAGNOSIS — K922 Gastrointestinal hemorrhage, unspecified: Secondary | ICD-10-CM

## 2013-09-14 DIAGNOSIS — D62 Acute posthemorrhagic anemia: Secondary | ICD-10-CM

## 2013-09-14 LAB — URINALYSIS, ROUTINE W REFLEX MICROSCOPIC
Glucose, UA: NEGATIVE mg/dL
HGB URINE DIPSTICK: NEGATIVE
KETONES UR: NEGATIVE mg/dL
Leukocytes, UA: NEGATIVE
Nitrite: NEGATIVE
PH: 6.5 (ref 5.0–8.0)
Protein, ur: NEGATIVE mg/dL
SPECIFIC GRAVITY, URINE: 1.024 (ref 1.005–1.030)
Urobilinogen, UA: 1 mg/dL (ref 0.0–1.0)

## 2013-09-14 LAB — CBC
HCT: 27.8 % — ABNORMAL LOW (ref 39.0–52.0)
HEMATOCRIT: 30.1 % — AB (ref 39.0–52.0)
HEMOGLOBIN: 9.4 g/dL — AB (ref 13.0–17.0)
Hemoglobin: 10.3 g/dL — ABNORMAL LOW (ref 13.0–17.0)
MCH: 31.9 pg (ref 26.0–34.0)
MCH: 31.9 pg (ref 26.0–34.0)
MCHC: 34.2 g/dL (ref 30.0–36.0)
MCHC: 33.8 g/dL (ref 30.0–36.0)
MCV: 93.2 fL (ref 78.0–100.0)
MCV: 94.2 fL (ref 78.0–100.0)
Platelets: 74 10*3/uL — ABNORMAL LOW (ref 150–400)
Platelets: 65 10*3/uL — ABNORMAL LOW (ref 150–400)
RBC: 3.23 MIL/uL — ABNORMAL LOW (ref 4.22–5.81)
RBC: 2.95 MIL/uL — ABNORMAL LOW (ref 4.22–5.81)
RDW: 17.3 % — ABNORMAL HIGH (ref 11.5–15.5)
RDW: 17.5 % — ABNORMAL HIGH (ref 11.5–15.5)
WBC: 6.7 10*3/uL (ref 4.0–10.5)
WBC: 5.8 10*3/uL (ref 4.0–10.5)

## 2013-09-14 LAB — BASIC METABOLIC PANEL
BUN: 17 mg/dL (ref 6–23)
CALCIUM: 7.5 mg/dL — AB (ref 8.4–10.5)
CHLORIDE: 111 meq/L (ref 96–112)
CO2: 23 meq/L (ref 19–32)
Creatinine, Ser: 0.5 mg/dL (ref 0.50–1.35)
GFR calc Af Amer: >90 mL/min (ref >90)
GFR calc non Af Amer: >90 mL/min (ref >90)
Glucose, Bld: 181 mg/dL — ABNORMAL HIGH (ref 70–99)
POTASSIUM: 4.3 meq/L (ref 3.7–5.3)
SODIUM: 144 meq/L (ref 137–147)

## 2013-09-14 LAB — HEMOGLOBIN AND HEMATOCRIT, BLOOD
HEMATOCRIT: 28 % — AB (ref 39.0–52.0)
HEMATOCRIT: 25.6 % — AB (ref 39.0–52.0)
HEMOGLOBIN: 8.9 g/dL — AB (ref 13.0–17.0)
Hemoglobin: 9.5 g/dL — ABNORMAL LOW (ref 13.0–17.0)

## 2013-09-14 MED ORDER — LORAZEPAM 2 MG/ML IJ SOLN
1.0000 mg | Freq: Once | INTRAMUSCULAR | Status: AC
  Administered 2013-09-14: 1 mg via INTRAVENOUS
  Filled 2013-09-14: qty 1

## 2013-09-14 MED ORDER — PANTOPRAZOLE SODIUM 40 MG IV SOLR
40.0000 mg | Freq: Two times a day (BID) | INTRAVENOUS | Status: DC
  Administered 2013-09-14 – 2013-09-18 (×9): 40 mg via INTRAVENOUS
  Filled 2013-09-14 (×12): qty 40

## 2013-09-14 MED ORDER — LORAZEPAM 2 MG/ML IJ SOLN
1.0000 mg | INTRAMUSCULAR | Status: DC | PRN
  Administered 2013-09-14 – 2013-09-15 (×2): 1 mg via INTRAVENOUS
  Filled 2013-09-14 (×2): qty 1

## 2013-09-14 MED ORDER — DEXTROSE-NACL 5-0.45 % IV SOLN
INTRAVENOUS | Status: DC
Start: 2013-09-14 — End: 2013-09-16
  Administered 2013-09-14 – 2013-09-15 (×3): via INTRAVENOUS

## 2013-09-14 NOTE — Progress Notes (Signed)
Name: Jake [name] MRN: [id number] DOB: 1956/06/01    ADMISSION DATE:  09/13/2013  REFERRING MD :  [facility]  CHIEF COMPLAINT:  hematemesis  BRIEF PATIENT DESCRIPTION:  58 y.o active ETOH user with ETOH cirrhosis , known varices, presented with massive UGIB, intubated in ED to enable safe EGD  SIGNIFICANT EVENTS: 1/02 Admit  STUDIES:  EGD 1/2 >> esophageal varices, difficult to exam stomach  LINES / TUBES: ETT 1/2 >> 1/3 Rt IJ 1/2 >>  CULTURES: none  ANTIBIOTICS: cipro 1/2 >> Rocephin 1/2 >>  SUBJECTIVE:  Wants ETT out.  Denies chest/abdominal pain.  VITAL SIGNS: Temp:  [98.2 F (36.8 C)-98.3 F (36.8 C)] 98.3 F (36.8 C) (01/03 0000) Pulse Rate:  [63-82] 71 (01/03 0800) Resp:  [12-24] 18 (01/03 0800) BP: (81-151)/(46-87) 99/58 mmHg (01/03 0800) SpO2:  [93 %-100 %] 99 % (01/03 0800) FiO2 (%):  [40 %-100 %] 40 % (01/03 0844) Weight:  [200 lb 9.9 oz (91 kg)-203 lb 11.3 oz (92.4 kg)] 203 lb 11.3 oz (92.4 kg) (01/03 0600) VENTILATOR SETTINGS: Vent Mode:  [-] CPAP FiO2 (%):  [40 %-100 %] 40 % Set Rate:  [18 bmp] 18 bmp Vt Set:  [580 mL] 580 mL PEEP:  [5 cmH20] 5 cmH20 Pressure Support:  [5 cmH20] 5 cmH20 Plateau Pressure:  [16 cmH20-20 cmH20] 17 cmH20 INTAKE / OUTPUT: Intake/Output     01/02 0701 - 01/03 0700 01/03 0701 - 01/04 0700   I.V. (mL/kg) 1205.3 (13) 47.5 (0.5)   IV Piggyback 200 200   Total Intake(mL/kg) 1405.3 (15.2) 247.5 (2.7)   Urine (mL/kg/hr) 375 525 (2.7)   Emesis/NG output 1000    Total Output 1375 525   Net +30.3 -277.5        Stool Occurrence 2 x      PHYSICAL EXAMINATION: Gen: No distress HEENT: ETT in place Lungs: no wheeze Cardiovascular: regular, no murmur Abdomen: soft and non-tender Musculoskeletal: No edema Neuro: alert, calm, follows commands Skin: no rashes   LABS:  CBC  Recent Labs Lab 09/13/13 2045 09/14/13 0220 09/14/13 0600  WBC 7.8 6.7 5.8  HGB 10.2* 10.3* 9.4*  HCT 30.0* 30.1* 27.8*  PLT  96* 74* 65*   Coag's  Recent Labs Lab 09/13/13 1410  INR 1.48   BMET  Recent Labs Lab 09/13/13 1410 09/14/13 0600  NA 136* 144  K 4.6 4.3  CL 103 111  CO2 23 23  BUN 12 17  CREATININE 0.53 0.50  GLUCOSE 193* 181*   Electrolytes  Recent Labs Lab 09/13/13 1410 09/14/13 0600  CALCIUM 8.0* 7.5*   Sepsis Markers  Recent Labs Lab 09/13/13 1350  LATICACIDVEN 1.5   ABG  Recent Labs Lab 09/13/13 1743  PHART 7.408  PCO2ART 36.8  PO2ART 191.0*   Liver Enzymes  Recent Labs Lab 09/13/13 1410  AST 65*  ALT 44  ALKPHOS 202*  BILITOT 2.9*  ALBUMIN 2.9*   Imaging Dg Chest Port 1 View  09/14/2013   CLINICAL DATA:  ETT position  EXAM: PORTABLE CHEST - 1 VIEW  COMPARISON:  09/13/2013  FINDINGS: Endotracheal tube ends in the mid thoracic trachea. Right IJ central venous catheter ends at the superior cavoatrial junction.  Normal heart size for technique. Perihilar opacities, morphology favoring atelectasis. No effusion or pneumothorax. No pulmonary edema.  IMPRESSION: 1. Good positioning of endotracheal tube and right IJ catheter. 2. Mildly improved aeration.  Perihilar opacity persist.   Electronically Signed   By: Roderic Palau  Watts M.D.   On: 09/14/2013 06:04   Dg Chest Port 1 View  09/13/2013   CLINICAL DATA:  Intubation.  Central line placement.  EXAM: PORTABLE CHEST - 1 VIEW  COMPARISON:  01/16/2013.  FINDINGS: Endotracheal tube tip 2.7 cm above the carina. Right IJ line in good anatomic position. Poor inspiration with bilateral platelike atelectasis. Bilateral mild pneumonia cannot be excluded. Cardiomegaly with normal pulmonary vascularity noted. No pleural effusion or pneumothorax.  IMPRESSION: 1. Good line and tube positions. 2. Poor inspiration with bilateral platelike areas of atelectasis. Mild areas of infiltrate in both lung fields cannot be excluded. Follow-up chest x-ray suggested.   Electronically Signed   By: Marcello Moores  Register   On: 09/13/2013 17:01     ASSESSMENT / PLAN:  PULMONARY A: Acute respiratory failure 2nd to airway protection for EGD. P:   -proceed with extubation 1/3 -f/u CXR as needed  CARDIOVASCULAR A:  Hemorrhagic shock 2nd to Upper GI bleeding >> improved 1/03. P:  -continue IV fluids -monitor hemodynamics -hold outpt inderal for now  RENAL A:  No issues. P:   -monitor renal fx, urine outpt, electrolytes  GASTROINTESTINAL A:   Upper GI bleeding 2nd to esophageal varices. ETOH cirrhosis. Elevated ammonia level 1/02 2nd to GI bleeding. P:   -NPO with ice chips >> advance diet per GI -protonix, octreotide per GI  HEMATOLOGIC A:   Anemia - acute blood loss. Thrombocytopenia -due to ETOH. P:  -f/u Hb/Hct q8h -transfuse for Hb < 7 or bleeding  INFECTIOUS A:   GI prophylaxis in setting of esophageal varices. P:   -Cipro, rocephin per GI  ENDOCRINE A:   No issues. P:   -monitor blood sugar on BMET  NEUROLOGIC A:   Hx of ETOH. P:   -CIWA q4h, and give ativan 1 to 4 mg IV q4h prn for CIWA > 8 -folic acid, thiamine IV -add dextrose to IV fluids -prn fentanyl for pain  CC time 35 minutes.  Chesley Mires, MD [facility] Pulmonary/Critical Care 09/14/2013, 9:14 AM Pager:  [phone] After 3pm call: [phone]

## 2013-09-14 NOTE — Progress Notes (Signed)
Extubated to 4L nasal cannula sats 94 hr 82

## 2013-09-14 NOTE — Progress Notes (Signed)
[facility] PROGRESS NOTE Subjective patient of Dr. Watt Climes who came in again with another variceal bleed. He has alcoholic cirrhosis and has continued to drink. EGD by Dr. Amedeo Plenty yesterday showed large varices with active bleeding. 5 bands were placed in the varices with what appeared to be improved. This is the 4th EGD with banding over the past year or so. Patient did have a large amount of blood in the stomach and it could not be completely cleared. He is been stable overnight and has been extubated.. No signs of gross bleeding  Objective: Vital signs in last 24 hours: Temp:  [98.2 F (36.8 C)-98.3 F (36.8 C)] 98.3 F (36.8 C) (01/03 0000) Pulse Rate:  [63-82] 73 (01/03 0900) Resp:  [12-24] 18 (01/03 0900) BP: (81-151)/(46-87) 123/60 mmHg (01/03 0900) SpO2:  [93 %-100 %] 99 % (01/03 0900) FiO2 (%):  [40 %-100 %] 40 % (01/03 0844) Weight:  [91 kg (200 lb 9.9 oz)-92.4 kg (203 lb 11.3 oz)] 92.4 kg (203 lb 11.3 oz) (01/03 0600) Last BM Date: 09/13/13  Intake/Output from previous day: 01/02 0701 - 01/03 0700 In: 1405.3 [I.V.:1205.3; IV Piggyback:200] Out: 0350 [Urine:375; Emesis/NG output:1000] Intake/Output this shift: Total I/O In: 347.5 [I.V.:147.5; IV Piggyback:200] Out: 525 [Urine:525]  PE: General-- patient sitting in bed reading the newspaper sipping on ice chips Heart-- Lungs-- Abdomen-- soft and nontender  Lab Results:  Recent Labs  09/13/13 1350 09/13/13 1642 09/13/13 2045 09/14/13 0220 09/14/13 0600  WBC 9.5 12.8* 7.8 6.7 5.8  HGB 13.1 10.5* 10.2* 10.3* 9.4*  HCT 39.0 31.8* 30.0* 30.1* 27.8*  PLT 111* 116* 96* 74* 65*   BMET  Recent Labs  09/13/13 1410 09/14/13 0600  NA 136* 144  K 4.6 4.3  CL 103 111  CO2 23 23  CREATININE 0.53 0.50   LFT  Recent Labs  09/13/13 1410  PROT 5.8*  AST 65*  ALT 44  ALKPHOS 202*  BILITOT 2.9*  BILIDIR 1.4*  IBILI 1.5*   PT/INR  Recent Labs  09/13/13 1410  LABPROT 17.5*  INR 1.48    PANCREAS  Recent Labs  09/13/13 1410  LIPASE 49         Studies/Results: Dg Chest Port 1 View  09/14/2013   CLINICAL DATA:  ETT position  EXAM: PORTABLE CHEST - 1 VIEW  COMPARISON:  09/13/2013  FINDINGS: Endotracheal tube ends in the mid thoracic trachea. Right IJ central venous catheter ends at the superior cavoatrial junction.  Normal heart size for technique. Perihilar opacities, morphology favoring atelectasis. No effusion or pneumothorax. No pulmonary edema.  IMPRESSION: 1. Good positioning of endotracheal tube and right IJ catheter. 2. Mildly improved aeration.  Perihilar opacity persist.   Electronically Signed   By: Jorje Guild M.D.   On: 09/14/2013 06:04   Dg Chest Port 1 View  09/13/2013   CLINICAL DATA:  Intubation.  Central line placement.  EXAM: PORTABLE CHEST - 1 VIEW  COMPARISON:  01/16/2013.  FINDINGS: Endotracheal tube tip 2.7 cm above the carina. Right IJ line in good anatomic position. Poor inspiration with bilateral platelike atelectasis. Bilateral mild pneumonia cannot be excluded. Cardiomegaly with normal pulmonary vascularity noted. No pleural effusion or pneumothorax.  IMPRESSION: 1. Good line and tube positions. 2. Poor inspiration with bilateral platelike areas of atelectasis. Mild areas of infiltrate in both lung fields cannot be excluded. Follow-up chest x-ray suggested.   Electronically Signed   By: Marcello Moores  Register   On: 09/13/2013 17:01    Medications: I have  reviewed the patient's current medications.  Assessment/Plan: 1. Variceal bleed. Patient stable in octreotide after EGD with esophageal banding yesterday. This is his 4th banding episode over the past year and he has continued to drink. Long discussion with he and his wife. He definitely will need to stop drinking completely and will need his esophageal varices banded completely to obliteration as an outpatient. We will go ahead and allow ice chips with Soda and continue octreotide   Korinna Tat  JR,Jakarri Lesko L 09/14/2013, 9:44 AM

## 2013-09-15 DIAGNOSIS — F411 Generalized anxiety disorder: Secondary | ICD-10-CM

## 2013-09-15 DIAGNOSIS — G4700 Insomnia, unspecified: Secondary | ICD-10-CM

## 2013-09-15 LAB — BASIC METABOLIC PANEL
BUN: 11 mg/dL (ref 6–23)
CALCIUM: 7.6 mg/dL — AB (ref 8.4–10.5)
CO2: 26 meq/L (ref 19–32)
CREATININE: 0.61 mg/dL (ref 0.50–1.35)
Chloride: 103 mEq/L (ref 96–112)
GFR calc Af Amer: >90 mL/min (ref >90)
Glucose, Bld: 149 mg/dL — ABNORMAL HIGH (ref 70–99)
Potassium: 3.7 mEq/L (ref 3.7–5.3)
SODIUM: 138 meq/L (ref 137–147)

## 2013-09-15 LAB — CBC
HCT: 24.3 % — ABNORMAL LOW (ref 39.0–52.0)
HEMOGLOBIN: 8.3 g/dL — AB (ref 13.0–17.0)
MCH: 32 pg (ref 26.0–34.0)
MCHC: 34.2 g/dL (ref 30.0–36.0)
MCV: 93.8 fL (ref 78.0–100.0)
Platelets: 56 10*3/uL — ABNORMAL LOW (ref 150–400)
RBC: 2.59 MIL/uL — AB (ref 4.22–5.81)
RDW: 17.5 % — ABNORMAL HIGH (ref 11.5–15.5)
WBC: 3.2 10*3/uL — ABNORMAL LOW (ref 4.0–10.5)

## 2013-09-15 LAB — MAGNESIUM: Magnesium: 1.6 mg/dL (ref 1.5–2.5)

## 2013-09-15 LAB — PHOSPHORUS: PHOSPHORUS: 2.7 mg/dL (ref 2.3–4.6)

## 2013-09-15 MED ORDER — LORAZEPAM 2 MG/ML IJ SOLN
1.0000 mg | INTRAMUSCULAR | Status: DC | PRN
  Administered 2013-09-15 – 2013-09-17 (×8): 1 mg via INTRAVENOUS
  Administered 2013-09-18 (×2): 2 mg via INTRAVENOUS
  Filled 2013-09-15 (×11): qty 1

## 2013-09-15 NOTE — Progress Notes (Signed)
Name: Jake [name] MRN: [id number] DOB: 10-Feb-1956    ADMISSION DATE:  09/13/2013  REFERRING MD :  [facility]  CHIEF COMPLAINT:  hematemesis  BRIEF PATIENT DESCRIPTION:  58 y.o active ETOH user with ETOH cirrhosis , known varices, presented with massive UGIB, intubated in ED to enable safe EGD  SIGNIFICANT EVENTS: 1/02 Admit  STUDIES:  EGD 1/2 >> esophageal varices, difficult to exam stomach  LINES / TUBES: ETT 1/2 >> 1/3 Rt IJ 1/2 >>  CULTURES: none  ANTIBIOTICS: cipro 1/2 >> Rocephin 1/2 >>  SUBJECTIVE:  Needed ativan x 2 doses over past 24 hrs.  Denies chest pain, dyspnea.  Abdominal muscles sore from previous vomiting.  VITAL SIGNS: Temp:  [98.4 F (36.9 C)-99 F (37.2 C)] 98.4 F (36.9 C) (01/04 0803) Pulse Rate:  [73-93] 86 (01/04 0600) Resp:  [12-21] 14 (01/04 0600) BP: (89-146)/(41-69) 97/54 mmHg (01/04 0600) SpO2:  [91 %-99 %] 91 % (01/04 0600) Weight:  [206 lb 12.7 oz (93.8 kg)] 206 lb 12.7 oz (93.8 kg) (01/04 0500) Room air  INTAKE / OUTPUT: Intake/Output     01/03 0701 - 01/04 0700 01/04 0701 - 01/05 0700   P.O. 60    I.V. (mL/kg) 2272.5 (24.2)    IV Piggyback 400    Total Intake(mL/kg) 2732.5 (29.1)    Urine (mL/kg/hr) 2550 (1.1)    Emesis/NG output     Total Output 2550     Net +182.5          Stool Occurrence 8 x      PHYSICAL EXAMINATION: Gen: No distress HEENT: no sinus tenderness Lungs: no wheeze Cardiovascular: regular, no murmur Abdomen: soft and non-tender Musculoskeletal: No edema Neuro: alert, calm, follows commands Skin: no rashes   LABS:  CBC  Recent Labs Lab 09/14/13 0220 09/14/13 0600 09/14/13 1550 09/14/13 2200 09/15/13 0500  WBC 6.7 5.8  --   --  3.2*  HGB 10.3* 9.4* 9.5* 8.9* 8.3*  HCT 30.1* 27.8* 28.0* 25.6* 24.3*  PLT 74* 65*  --   --  56*   Coag's  Recent Labs Lab 09/13/13 1410  INR 1.48   BMET  Recent Labs Lab 09/13/13 1410 09/14/13 0600 09/15/13 0500  NA 136* 144 138  K  4.6 4.3 3.7  CL 103 111 103  CO2 23 23 26   BUN 12 17 11   CREATININE 0.53 0.50 0.61  GLUCOSE 193* 181* 149*   Electrolytes  Recent Labs Lab 09/13/13 1410 09/14/13 0600 09/15/13 0500  CALCIUM 8.0* 7.5* 7.6*  MG  --   --  1.6  PHOS  --   --  2.7   Sepsis Markers  Recent Labs Lab 09/13/13 1350  LATICACIDVEN 1.5   ABG  Recent Labs Lab 09/13/13 1743  PHART 7.408  PCO2ART 36.8  PO2ART 191.0*   Liver Enzymes  Recent Labs Lab 09/13/13 1410  AST 65*  ALT 44  ALKPHOS 202*  BILITOT 2.9*  ALBUMIN 2.9*   Imaging Dg Chest Port 1 View  09/14/2013   CLINICAL DATA:  ETT position  EXAM: PORTABLE CHEST - 1 VIEW  COMPARISON:  09/13/2013  FINDINGS: Endotracheal tube ends in the mid thoracic trachea. Right IJ central venous catheter ends at the superior cavoatrial junction.  Normal heart size for technique. Perihilar opacities, morphology favoring atelectasis. No effusion or pneumothorax. No pulmonary edema.  IMPRESSION: 1. Good positioning of endotracheal tube and right IJ catheter. 2. Mildly improved aeration.  Perihilar opacity persist.  Electronically Signed   By: Jorje Guild M.D.   On: 09/14/2013 06:04   Dg Chest Port 1 View  09/13/2013   CLINICAL DATA:  Intubation.  Central line placement.  EXAM: PORTABLE CHEST - 1 VIEW  COMPARISON:  01/16/2013.  FINDINGS: Endotracheal tube tip 2.7 cm above the carina. Right IJ line in good anatomic position. Poor inspiration with bilateral platelike atelectasis. Bilateral mild pneumonia cannot be excluded. Cardiomegaly with normal pulmonary vascularity noted. No pleural effusion or pneumothorax.  IMPRESSION: 1. Good line and tube positions. 2. Poor inspiration with bilateral platelike areas of atelectasis. Mild areas of infiltrate in both lung fields cannot be excluded. Follow-up chest x-ray suggested.   Electronically Signed   By: Marcello Moores  Register   On: 09/13/2013 17:01    ASSESSMENT / PLAN: GASTROINTESTINAL A:   Upper GI bleeding 2nd to  esophageal varices. ETOH cirrhosis. Elevated ammonia level 1/02 2nd to GI bleeding. P:   -NPO with ice chips >> advance diet per GI -protonix, octreotide per GI  PULMONARY A: Acute respiratory failure 2nd to airway protection for EGD >> extubated 1/03. P:   -bronchial hygiene -f/u CXR as needed  CARDIOVASCULAR A:  Hemorrhagic shock 2nd to Upper GI bleeding >> resolved 1/04. Hx of HTN. P:  -continue IV fluids -monitor hemodynamics -hold outpt inderal for now  RENAL A:  No issues. P:   -monitor renal fx, urine outpt, electrolytes  HEMATOLOGIC A:   Anemia - acute blood loss. Thrombocytopenia -due to ETOH. P:  -f/u CBC -transfuse for Hb < 7 or bleeding -SCD for DVT prevention  INFECTIOUS A:   GI prophylaxis in setting of esophageal varices. P:   -Cipro, rocephin per GI  ENDOCRINE A:   No issues. P:   -monitor blood sugar on BMET  NEUROLOGIC A:   Hx of ETOH. Hx of anxiety, insomnia. P:   -CIWA q4h, and give ativan 1 to 4 mg IV q4h prn for CIWA > 8, or anxiety -folic acid, thiamine IV -added dextrose to IV fluids -prn fentanyl for pain -resume prn xanax, ambien when able to take pills  Summary: Transfer service to [facility] 1/05 and PCCM sign off.  Chesley Mires, MD [facility] Pulmonary/Critical Care 09/15/2013, 8:48 AM Pager:  [phone] After 3pm call: [phone]

## 2013-09-15 NOTE — Progress Notes (Signed)
[facility] PROGRESS NOTE Subjective Pt w/o further bleeding.  Objective: Vital signs in last 24 hours: Temp:  [98.4 F (36.9 C)-99 F (37.2 C)] 98.4 F (36.9 C) (01/04 0803) Pulse Rate:  [75-93] 87 (01/04 0800) Resp:  [12-21] 17 (01/04 0800) BP: (89-146)/(41-69) 106/58 mmHg (01/04 0800) SpO2:  [89 %-98 %] 89 % (01/04 0800) Weight:  [93.8 kg (206 lb 12.7 oz)] 93.8 kg (206 lb 12.7 oz) (01/04 0500) Last BM Date: 09/15/13  Intake/Output from previous day: 01/03 0701 - 01/04 0700 In: 2842.5 [P.O.:60; I.V.:2382.5; IV Piggyback:400] Out: 2550 [Urine:2550] Intake/Output this shift: Total I/O In: 220 [I.V.:220] Out: -   PE: General--alert oriented  Abdomen--soft nontender  Lab Results:  Recent Labs  09/13/13 1642 09/13/13 2045 09/14/13 0220 09/14/13 0600 09/14/13 1550 09/14/13 2200 09/15/13 0500  WBC 12.8* 7.8 6.7 5.8  --   --  3.2*  HGB 10.5* 10.2* 10.3* 9.4* 9.5* 8.9* 8.3*  HCT 31.8* 30.0* 30.1* 27.8* 28.0* 25.6* 24.3*  PLT 116* 96* 74* 65*  --   --  56*   BMET  Recent Labs  09/13/13 1410 09/14/13 0600 09/15/13 0500  NA 136* 144 138  K 4.6 4.3 3.7  CL 103 111 103  CO2 23 23 26   CREATININE 0.53 0.50 0.61   LFT  Recent Labs  09/13/13 1410  PROT 5.8*  AST 65*  ALT 44  ALKPHOS 202*  BILITOT 2.9*  BILIDIR 1.4*  IBILI 1.5*   PT/INR  Recent Labs  09/13/13 1410  LABPROT 17.5*  INR 1.48   PANCREAS  Recent Labs  09/13/13 1410  LIPASE 49         Studies/Results: Dg Chest Port 1 View  09/14/2013   CLINICAL DATA:  ETT position  EXAM: PORTABLE CHEST - 1 VIEW  COMPARISON:  09/13/2013  FINDINGS: Endotracheal tube ends in the mid thoracic trachea. Right IJ central venous catheter ends at the superior cavoatrial junction.  Normal heart size for technique. Perihilar opacities, morphology favoring atelectasis. No effusion or pneumothorax. No pulmonary edema.  IMPRESSION: 1. Good positioning of endotracheal tube and right IJ catheter. 2.  Mildly improved aeration.  Perihilar opacity persist.   Electronically Signed   By: Jorje Guild M.D.   On: 09/14/2013 06:04   Dg Chest Port 1 View  09/13/2013   CLINICAL DATA:  Intubation.  Central line placement.  EXAM: PORTABLE CHEST - 1 VIEW  COMPARISON:  01/16/2013.  FINDINGS: Endotracheal tube tip 2.7 cm above the carina. Right IJ line in good anatomic position. Poor inspiration with bilateral platelike atelectasis. Bilateral mild pneumonia cannot be excluded. Cardiomegaly with normal pulmonary vascularity noted. No pleural effusion or pneumothorax.  IMPRESSION: 1. Good line and tube positions. 2. Poor inspiration with bilateral platelike areas of atelectasis. Mild areas of infiltrate in both lung fields cannot be excluded. Follow-up chest x-ray suggested.   Electronically Signed   By: Marcello Moores  Register   On: 09/13/2013 17:01    Medications: I have reviewed the patient's current medications.  Assessment/Plan: 1. Variceal Bleed.  Stable at present.  Octreotide should be at 25/hr and will give CLs   Natajah Derderian JR,Yadhira Mckneely L 09/15/2013, 9:42 AM

## 2013-09-16 ENCOUNTER — Encounter (HOSPITAL_COMMUNITY): Payer: Self-pay | Admitting: Gastroenterology

## 2013-09-16 DIAGNOSIS — D61818 Other pancytopenia: Secondary | ICD-10-CM

## 2013-09-16 LAB — COMPREHENSIVE METABOLIC PANEL
ALK PHOS: 171 U/L — AB (ref 39–117)
ALT: 47 U/L (ref 0–53)
AST: 83 U/L — ABNORMAL HIGH (ref 0–37)
Albumin: 2.5 g/dL — ABNORMAL LOW (ref 3.5–5.2)
BILIRUBIN TOTAL: 3 mg/dL — AB (ref 0.3–1.2)
BUN: 8 mg/dL (ref 6–23)
CO2: 27 mEq/L (ref 19–32)
CREATININE: 0.67 mg/dL (ref 0.50–1.35)
Calcium: 7.7 mg/dL — ABNORMAL LOW (ref 8.4–10.5)
Chloride: 105 mEq/L (ref 96–112)
GFR calc non Af Amer: >90 mL/min (ref >90)
GLUCOSE: 136 mg/dL — AB (ref 70–99)
POTASSIUM: 3.8 meq/L (ref 3.7–5.3)
Sodium: 140 mEq/L (ref 137–147)
TOTAL PROTEIN: 4.9 g/dL — AB (ref 6.0–8.3)

## 2013-09-16 LAB — CBC
HCT: 26.6 % — ABNORMAL LOW (ref 39.0–52.0)
HEMATOCRIT: 22.8 % — AB (ref 39.0–52.0)
HEMOGLOBIN: 7.5 g/dL — AB (ref 13.0–17.0)
Hemoglobin: 9.1 g/dL — ABNORMAL LOW (ref 13.0–17.0)
MCH: 31.3 pg (ref 26.0–34.0)
MCH: 32.3 pg (ref 26.0–34.0)
MCHC: 32.9 g/dL (ref 30.0–36.0)
MCHC: 34.2 g/dL (ref 30.0–36.0)
MCV: 95 fL (ref 78.0–100.0)
MCV: 94.3 fL (ref 78.0–100.0)
PLATELETS: 67 10*3/uL — AB (ref 150–400)
Platelets: 53 10*3/uL — ABNORMAL LOW (ref 150–400)
RBC: 2.4 MIL/uL — ABNORMAL LOW (ref 4.22–5.81)
RBC: 2.82 MIL/uL — AB (ref 4.22–5.81)
RDW: 17.2 % — ABNORMAL HIGH (ref 11.5–15.5)
RDW: 17 % — ABNORMAL HIGH (ref 11.5–15.5)
WBC: 2.1 10*3/uL — ABNORMAL LOW (ref 4.0–10.5)
WBC: 2.9 10*3/uL — AB (ref 4.0–10.5)

## 2013-09-16 MED ORDER — FOLIC ACID 1 MG PO TABS
1.0000 mg | ORAL_TABLET | Freq: Every day | ORAL | Status: DC
  Administered 2013-09-16 – 2013-09-19 (×4): 1 mg via ORAL
  Filled 2013-09-16 (×5): qty 1

## 2013-09-16 MED ORDER — ADULT MULTIVITAMIN W/MINERALS CH
1.0000 | ORAL_TABLET | Freq: Every day | ORAL | Status: DC
  Administered 2013-09-16 – 2013-09-19 (×4): 1 via ORAL
  Filled 2013-09-16 (×4): qty 1

## 2013-09-16 MED ORDER — VITAMIN B-1 100 MG PO TABS
100.0000 mg | ORAL_TABLET | Freq: Every day | ORAL | Status: DC
  Administered 2013-09-16 – 2013-09-19 (×4): 100 mg via ORAL
  Filled 2013-09-16 (×4): qty 1

## 2013-09-16 MED ORDER — ALPRAZOLAM 0.5 MG PO TABS
0.5000 mg | ORAL_TABLET | Freq: Three times a day (TID) | ORAL | Status: DC | PRN
  Administered 2013-09-16 – 2013-09-19 (×7): 0.5 mg via ORAL
  Filled 2013-09-16 (×7): qty 1

## 2013-09-16 NOTE — Progress Notes (Signed)
CARE MANAGEMENT NOTE 09/16/2013  Patient:  ESTES, LEHNER   Account Number:  [id number]  Date Initiated:  09/16/2013  Documentation initiated by:  Yaacov Koziol  Subjective/Objective Assessment:   known etoh abuse hisotry and cirrhosis., massive varicle bleed, intubated and egd preformed, presssure support.     Action/Plan:   lives at home with spouse.   Anticipated DC Date:  09/19/2013   Anticipated DC Plan:  HOME/SELF CARE  In-house referral  Clinical Social Worker      DC Planning Services  NA      [facility] Choice  NA   Choice offered to / List presented to:  NA   DME arranged  NA      DME agency  NA     [place] arranged  NA      House agency  NA   Status of service:  In process, will continue to follow Medicare Important Message given?  NA - LOS <3 / Initial given by admissions (If response is "NO", the following Medicare IM given date fields will be blank) Date Medicare IM given:   Date Additional Medicare IM given:    Discharge Disposition:    Per UR Regulation:  Reviewed for med. necessity/level of care/duration of stay  If discussed at [place] of Stay Meetings, dates discussed:    Comments:  [id number]/Ebrahim Deremer Eldridge Dace, BSN, [place] [phone] Chart Reviewed for discharge and hospital needs. Discharge needs at time of review:  None present will follow for needs. Review of patient progress due on [id number].

## 2013-09-16 NOTE — Progress Notes (Signed)
[facility] PROGRESS NOTE Subjective No further gross bleeding. Tolerating clears.  Objective: Vital signs in last 24 hours: Temp:  [97.5 F (36.4 C)-98.7 F (37.1 C)] 98.6 F (37 C) (01/05 1200) Pulse Rate:  [78-81] 78 (01/05 0400) Resp:  [12-22] 16 (01/05 1200) BP: (108-147)/(52-83) 117/66 mmHg (01/05 1200) SpO2:  [91 %-97 %] 97 % (01/05 1200) Weight:  [93.2 kg (205 lb 7.5 oz)] 93.2 kg (205 lb 7.5 oz) (01/05 0521) Last BM Date: 09/15/13  Intake/Output from previous day: 01/04 0701 - 01/05 0700 In: 2937.5 [P.O.:240; I.V.:2057.5; IV Piggyback:400] Out: 2375 [Urine:2375] Intake/Output this shift: Total I/O In: 1142.5 [P.O.:480; I.V.:362.5; Other:100; IV Piggyback:200] Out: 1200 [Urine:1200]  PE: General--alert up and about  Abdomen--  Lab Results:  Recent Labs  09/13/13 2045 09/14/13 0220 09/14/13 0600 09/14/13 1550 09/14/13 2200 09/15/13 0500 09/16/13 0520  WBC 7.8 6.7 5.8  --   --  3.2* 2.1*  HGB 10.2* 10.3* 9.4* 9.5* 8.9* 8.3* 7.5*  HCT 30.0* 30.1* 27.8* 28.0* 25.6* 24.3* 22.8*  PLT 96* 74* 65*  --   --  56* 53*   BMET  Recent Labs  09/13/13 1410 09/14/13 0600 09/15/13 0500 09/16/13 0520  NA 136* 144 138 140  K 4.6 4.3 3.7 3.8  CL 103 111 103 105  CO2 23 23 26 27   CREATININE 0.53 0.50 0.61 0.67   LFT  Recent Labs  09/13/13 1410 09/16/13 0520  PROT 5.8* 4.9*  AST 65* 83*  ALT 44 47  ALKPHOS 202* 171*  BILITOT 2.9* 3.0*  BILIDIR 1.4*  --   IBILI 1.5*  --    PT/INR  Recent Labs  09/13/13 1410  LABPROT 17.5*  INR 1.48   PANCREAS  Recent Labs  09/13/13 1410  LIPASE 49         Studies/Results: No results found.  Medications: I have reviewed the patient's current medications.  Assessment/Plan: 1.Variceal Bleed. Pt up and about no gross bleeding . Still at risk for recurrent  Bleeding. Would continue octreotide for now and will advance to FLs. Should be ok to go to floor.   Loralai Eisman JR,Meko Bellanger L 09/16/2013, 2:01  PM

## 2013-09-16 NOTE — Progress Notes (Signed)
Please note that at home he takes Xanax 0.5mg  at least three times daily. He requested sleep medication. He received Ativan 1mg  IV at 2200 and 0200. Vital signs have been stable. Heart rate 78, NSR. Oxygen sats 94 on RA. He has not had any stools this shift. Voids in large amounts. Continues on Sandostatin infusion at 19mcg/hr.   He is reluctant to progress his activity. Please note abnormals in this mornings lab results.

## 2013-09-16 NOTE — Progress Notes (Addendum)
[facility] PROGRESS NOTE    Jake [name] GEX:[id number] DOB: February 17, 1956 DOA: 09/13/2013 PCP: Rikki Spearing PHUONG, DO  HPI/Brief narrative 58 y.o active ETOH user with ETOH cirrhosis , known varices, presented with massive variceal UGIB, intubated in ED to enable safe EGD- 1/2. S/p extubation 1/3. Transferred from CCM to Hospitalist's on 1/5.  Assessment/Plan:  Acute upper GI bleeding secondary to esophageal varices/alcoholic cirrhosis/elevated ammonia level due to GI bleeding - Status post EGD on 1/2 after intubation for airway protection. EGD showed esophageal varices. - GI bleeding seems to have clinically abated. Await GI follow up regarding advancing diet from clear liquids. On PPI and octreotide drip.  Acute respiratory failure secondary to airway protection for EGD - Status post extubation 1/3. Stable. CCM signed off.  Hemorrhagic shock secondary to GI bleeding - Resolved 1/4. Consider reducing IV fluids. - Outpatient Inderal on hold.  Acute post hemorrhagic anemia from GI bleed & pancytopenia due to EtOH - Monitor CBCs closely and transfuse for hemoglobin less than or equal to 7 g per DL.  History of alcohol dependence/anxiety/insomnia - Continue Ativan protocol, thiamine and folate. Some anxiety overnight. No overt withdrawal features. -  Resume home dose of Xanax when necessary.    Code Status: Full  Family Communication: None  Disposition Plan: Consider transferring to telemetry after GI followup.   Consultants:  Gastroenterology   Critical care medicine  Procedures:  Right IJ 1/2 >   ETT 1/2 > 1/3  EGD 1/2  Antibiotics: cipro 1/2 >>  Rocephin 1/2 >>    Subjective: Denies nausea or vomiting over the last 24 hours. Tolerating clear liquids. States that he had 3 watery "pink" BMs over last 24 hours but according to nursing, no BMs over the last 12 hours. Also per nursing, has some agitation and anxiety overnight  Objective: Filed Vitals:    09/16/13 0200 09/16/13 0400 09/16/13 0521 09/16/13 0600  BP: 121/59 108/54  116/69  Pulse:  78    Temp:   98.3 F (36.8 C)   TempSrc:   Oral   Resp: 13 15  13   Height:      Weight:   93.2 kg (205 lb 7.5 oz)   SpO2: 94% 91%  94%    Intake/Output Summary (Last 24 hours) at 09/16/13 0821 Last data filed at 09/16/13 0700  Gross per 24 hour  Intake   2815 ml  Output   2375 ml  Net    440 ml   Filed Weights   09/14/13 0600 09/15/13 0500 09/16/13 0521  Weight: 92.4 kg (203 lb 11.3 oz) 93.8 kg (206 lb 12.7 oz) 93.2 kg (205 lb 7.5 oz)     Exam:  General exam: Slightly anxious but comfortable male lying in bed.  Respiratory system: Clear. No increased work of breathing. Cardiovascular system: S1 & S2 heard, RRR. No JVD, murmurs, gallops, clicks or pedal edema. Telemetry: Sinus rhythm  Gastrointestinal system: Abdomen is nondistended, soft and nontender. Normal bowel sounds heard. Central nervous system: Alert and oriented. No focal neurological deficits. Extremities: Symmetric 5 x 5 power.Not tremulous.   Data Reviewed: Basic Metabolic Panel:  Recent Labs Lab 09/13/13 1410 09/14/13 0600 09/15/13 0500 09/16/13 0520  NA 136* 144 138 140  K 4.6 4.3 3.7 3.8  CL 103 111 103 105  CO2 23 23 26 27   GLUCOSE 193* 181* 149* 136*  BUN 12 17 11 8   CREATININE 0.53 0.50 0.61 0.67  CALCIUM 8.0* 7.5* 7.6* 7.7*  MG  --   --  1.6  --   PHOS  --   --  2.7  --    Liver Function Tests:  Recent Labs Lab 09/13/13 1410 09/16/13 0520  AST 65* 83*  ALT 44 47  ALKPHOS 202* 171*  BILITOT 2.9* 3.0*  PROT 5.8* 4.9*  ALBUMIN 2.9* 2.5*    Recent Labs Lab 09/13/13 1410  LIPASE 49    Recent Labs Lab 09/13/13 1350  AMMONIA 140*   CBC:  Recent Labs Lab 09/13/13 1350 09/13/13 1642 09/13/13 2045 09/14/13 0220 09/14/13 0600 09/14/13 1550 09/14/13 2200 09/15/13 0500 09/16/13 0520  WBC 9.5 12.8* 7.8 6.7 5.8  --   --  3.2* 2.1*  NEUTROABS 7.0 10.6*  --   --   --   --   --    --   --   HGB 13.1 10.5* 10.2* 10.3* 9.4* 9.5* 8.9* 8.3* 7.5*  HCT 39.0 31.8* 30.0* 30.1* 27.8* 28.0* 25.6* 24.3* 22.8*  MCV 92.9 94.1 93.2 93.2 94.2  --   --  93.8 95.0  PLT 111* 116* 96* 74* 65*  --   --  56* 53*   Cardiac Enzymes: No results found for this basename: CKTOTAL, CKMB, CKMBINDEX, TROPONINI,  in the last 168 hours BNP (last 3 results) No results found for this basename: PROBNP,  in the last 8760 hours CBG: No results found for this basename: GLUCAP,  in the last 168 hours  Recent Results (from the past 240 hour(s))  MRSA PCR SCREENING     Status: None   Collection Time    09/13/13  7:31 PM      Result Value Range Status   MRSA by PCR NEGATIVE  NEGATIVE Final   Comment:            The GeneXpert MRSA Assay (FDA     approved for NASAL specimens     only), is one component of a     comprehensive MRSA colonization     surveillance program. It is not     intended to diagnose MRSA     infection nor to guide or     monitor treatment for     MRSA infections.      Additional labs: 1. INR: 1.48 2. Blood alcohol level on admission <11     Studies: No results found.      Scheduled Meds: . antiseptic oral rinse  15 mL Mouth Rinse q12n4p  . chlorhexidine  15 mL Mouth Rinse BID  . ciprofloxacin  400 mg Intravenous Q12H  . fentaNYL  50 mcg Intravenous Once  . folic acid  1 mg Intravenous Daily  . pantoprazole (PROTONIX) IV  40 mg Intravenous Q12H  . thiamine  100 mg Intravenous Daily   Continuous Infusions: . sodium chloride 20 mL/hr at 09/14/13 0119  . dextrose 5 % and 0.45% NaCl 75 mL/hr at 09/15/13 2147  . octreotide (SANDOSTATIN) infusion 25 mcg/hr (09/16/13 0528)    Active Problems:   Acute upper GI bleed   UGI bleed    Time spent: 70 minutes    Jake Deeb, MD, FACP, FHM. [facility] Pager [phone]  If 7PM-7AM, please contact night-coverage [email or web address] Password [email or web address] 09/16/2013, 8:21 AM    LOS: 3 days

## 2013-09-17 LAB — TYPE AND SCREEN
ABO/RH(D): A NEG
Antibody Screen: NEGATIVE
UNIT DIVISION: 0
Unit division: 0
Unit division: 0

## 2013-09-17 LAB — COMPREHENSIVE METABOLIC PANEL
ALK PHOS: 210 U/L — AB (ref 39–117)
ALT: 47 U/L (ref 0–53)
AST: 70 U/L — ABNORMAL HIGH (ref 0–37)
Albumin: 2.5 g/dL — ABNORMAL LOW (ref 3.5–5.2)
BUN: 5 mg/dL — ABNORMAL LOW (ref 6–23)
CO2: 26 meq/L (ref 19–32)
Calcium: 7.7 mg/dL — ABNORMAL LOW (ref 8.4–10.5)
Chloride: 104 mEq/L (ref 96–112)
Creatinine, Ser: 0.68 mg/dL (ref 0.50–1.35)
GLUCOSE: 122 mg/dL — AB (ref 70–99)
POTASSIUM: 3.7 meq/L (ref 3.7–5.3)
Sodium: 140 mEq/L (ref 137–147)
Total Bilirubin: 3.2 mg/dL — ABNORMAL HIGH (ref 0.3–1.2)
Total Protein: 5.1 g/dL — ABNORMAL LOW (ref 6.0–8.3)

## 2013-09-17 LAB — CBC
HCT: 24.5 % — ABNORMAL LOW (ref 39.0–52.0)
HEMOGLOBIN: 8.4 g/dL — AB (ref 13.0–17.0)
MCH: 32.3 pg (ref 26.0–34.0)
MCHC: 34.3 g/dL (ref 30.0–36.0)
MCV: 94.2 fL (ref 78.0–100.0)
Platelets: 59 10*3/uL — ABNORMAL LOW (ref 150–400)
RBC: 2.6 MIL/uL — AB (ref 4.22–5.81)
RDW: 16.9 % — ABNORMAL HIGH (ref 11.5–15.5)
WBC: 2.2 10*3/uL — ABNORMAL LOW (ref 4.0–10.5)

## 2013-09-17 NOTE — Progress Notes (Signed)
[facility] PROGRESS NOTE Subjective Pt wants to go home to chop wood for wood stove. Tolerating FLs ok. Dark stool no gross bleeding.  Objective: Vital signs in last 24 hours: Temp:  [98.4 F (36.9 C)-99.6 F (37.6 C)] 98.6 F (37 C) (01/06 0615) Pulse Rate:  [77-83] 77 (01/06 0615) Resp:  [14-22] 18 (01/06 0615) BP: (111-140)/(54-72) 111/72 mmHg (01/06 0615) SpO2:  [94 %-98 %] 95 % (01/06 0615) Weight:  [90.5 kg (199 lb 8.3 oz)] 90.5 kg (199 lb 8.3 oz) (01/05 2100) Last BM Date: 09/16/13  Intake/Output from previous day: 01/05 0701 - 01/06 0700 In: 2725 [P.O.:840; I.V.:1325; IV Piggyback:400] Out: 3225 [Urine:3225] Intake/Output this shift:    PE: General--NAD Heart-- Lungs--clear Abdomen--soft nontender  Lab Results:  Recent Labs  09/14/13 2200 09/15/13 0500 09/16/13 0520 09/16/13 1715 09/17/13 0520  WBC  --  3.2* 2.1* 2.9* 2.2*  HGB 8.9* 8.3* 7.5* 9.1* 8.4*  HCT 25.6* 24.3* 22.8* 26.6* 24.5*  PLT  --  56* 53* 67* 59*   BMET  Recent Labs  09/15/13 0500 09/16/13 0520 09/17/13 0520  NA 138 140 140  K 3.7 3.8 3.7  CL 103 105 104  CO2 26 27 26   CREATININE 0.61 0.67 0.68   LFT  Recent Labs  09/16/13 0520 09/17/13 0520  PROT 4.9* 5.1*  AST 83* 70*  ALT 47 47  ALKPHOS 171* 210*  BILITOT 3.0* 3.2*   PT/INR No results found for this basename: LABPROT, INR,  in the last 72 hours PANCREAS No results found for this basename: LIPASE,  in the last 72 hours       Studies/Results: No results found.  Medications: I have reviewed the patient's current medications.  Assessment/Plan: 1. Variceal Bleed. No gross bleeding 4 days on octreotide s/p banding. Would continue octreotide  1 more day and d/c in am if stable, continue FLs for now. Told pt he is still at high risk for continued bleeding.If stable tomorrow would start propranolol or nadolol to reduce portal pressure.   Jake [name],[name] L 09/17/2013, 7:48 AM

## 2013-09-17 NOTE — Progress Notes (Signed)
[facility] PROGRESS NOTE    WAYDEN SCHWERTNER PFX:[id number] DOB: 03/16/1956 DOA: 09/13/2013 PCP: Rikki Spearing PHUONG, DO  HPI/Brief narrative 58 y.o active ETOH user with ETOH cirrhosis , known varices, presented with massive variceal UGIB, intubated in ED to enable safe EGD- 1/2. S/p extubation 1/3. Transferred from CCM to Hospitalist's on 1/5.  Assessment/Plan:  Acute upper GI bleeding secondary to esophageal varices/alcoholic cirrhosis/elevated ammonia level due to GI bleeding - Status post EGD on 1/2 after intubation for airway protection. EGD showed esophageal varices. - GI bleeding seems to have clinically abated. On PPI and octreotide drip. GI recommends continued for liquid diet and IV octreotide infusion for additional day. Patient reluctantly willing to stay additional 24 hours but states that he has to leave tomorrow  Acute respiratory failure secondary to airway protection for EGD - Status post extubation 1/3. Stable. CCM signed off.  Hemorrhagic shock secondary to GI bleeding - Resolved 1/4. DC IV fluids - Outpatient Inderal on hold.  Acute post hemorrhagic anemia from GI bleed & pancytopenia due to EtOH - Monitor CBCs closely and transfuse for hemoglobin less than or equal to 7 g per DL. - Stable  History of alcohol dependence/anxiety/insomnia - Continue Ativan protocol, thiamine and folate. Some anxiety overnight. No overt withdrawal features. -  Resumed home dose of Xanax when necessary.    Code Status: Full  Family Communication: Discussed with spouse at bedside Disposition Plan: Possible discharge home 1/7   Consultants:  Gastroenterology   Critical care medicine  Procedures:  Right IJ 1/2 >   ETT 1/2 > 1/3  EGD 1/2  Antibiotics: cipro 1/2 >>  Rocephin 1/2 >>    Subjective: No nausea or vomiting. Tolerating full liquid diet. Couple of BMs with dark stools but no red blood.  Objective: Filed Vitals:   09/16/13 2000 09/16/13 2100 09/17/13  0615 09/17/13 1445  BP: 113/54 120/69 111/72 109/64  Pulse:  83 77 86  Temp:  99.6 F (37.6 C) 98.6 F (37 C) 98.4 F (36.9 C)  TempSrc:  Oral Oral Oral  Resp: 16 18 18 18   Height:  5\' 10"  (1.778 m)    Weight:  90.5 kg (199 lb 8.3 oz)    SpO2: 97% 96% 95% 97%    Intake/Output Summary (Last 24 hours) at 09/17/13 1806 Last data filed at 09/17/13 1600  Gross per 24 hour  Intake 1537.5 ml  Output   3175 ml  Net -1637.5 ml   Filed Weights   09/15/13 0500 09/16/13 0521 09/16/13 2100  Weight: 93.8 kg (206 lb 12.7 oz) 93.2 kg (205 lb 7.5 oz) 90.5 kg (199 lb 8.3 oz)     Exam:  General exam: Slightly anxious but comfortable male lying in bed.  Respiratory system: Clear. No increased work of breathing. Cardiovascular system: S1 & S2 heard, RRR. No JVD, murmurs, gallops, clicks or pedal edema.  Gastrointestinal system: Abdomen is nondistended, soft and nontender. Normal bowel sounds heard. Central nervous system: Alert and oriented. No focal neurological deficits. Extremities: Symmetric 5 x 5 power.Not tremulous.   Data Reviewed: Basic Metabolic Panel:  Recent Labs Lab 09/13/13 1410 09/14/13 0600 09/15/13 0500 09/16/13 0520 09/17/13 0520  NA 136* 144 138 140 140  K 4.6 4.3 3.7 3.8 3.7  CL 103 111 103 105 104  CO2 23 23 26 27 26   GLUCOSE 193* 181* 149* 136* 122*  BUN 12 17 11 8  5*  CREATININE 0.53 0.50 0.61 0.67 0.68  CALCIUM 8.0* 7.5* 7.6* 7.7*  7.7*  MG  --   --  1.6  --   --   PHOS  --   --  2.7  --   --    Liver Function Tests:  Recent Labs Lab 09/13/13 1410 09/16/13 0520 09/17/13 0520  AST 65* 83* 70*  ALT 44 47 47  ALKPHOS 202* 171* 210*  BILITOT 2.9* 3.0* 3.2*  PROT 5.8* 4.9* 5.1*  ALBUMIN 2.9* 2.5* 2.5*    Recent Labs Lab 09/13/13 1410  LIPASE 49    Recent Labs Lab 09/13/13 1350  AMMONIA 140*   CBC:  Recent Labs Lab 09/13/13 1350 09/13/13 1642  09/14/13 0600  09/14/13 2200 09/15/13 0500 09/16/13 0520 09/16/13 1715 09/17/13 0520   WBC 9.5 12.8*  < > 5.8  --   --  3.2* 2.1* 2.9* 2.2*  NEUTROABS 7.0 10.6*  --   --   --   --   --   --   --   --   HGB 13.1 10.5*  < > 9.4*  < > 8.9* 8.3* 7.5* 9.1* 8.4*  HCT 39.0 31.8*  < > 27.8*  < > 25.6* 24.3* 22.8* 26.6* 24.5*  MCV 92.9 94.1  < > 94.2  --   --  93.8 95.0 94.3 94.2  PLT 111* 116*  < > 65*  --   --  56* 53* 67* 59*  < > = values in this interval not displayed. Cardiac Enzymes: No results found for this basename: CKTOTAL, CKMB, CKMBINDEX, TROPONINI,  in the last 168 hours BNP (last 3 results) No results found for this basename: PROBNP,  in the last 8760 hours CBG: No results found for this basename: GLUCAP,  in the last 168 hours  Recent Results (from the past 240 hour(s))  MRSA PCR SCREENING     Status: None   Collection Time    09/13/13  7:31 PM      Result Value Range Status   MRSA by PCR NEGATIVE  NEGATIVE Final   Comment:            The GeneXpert MRSA Assay (FDA     approved for NASAL specimens     only), is one component of a     comprehensive MRSA colonization     surveillance program. It is not     intended to diagnose MRSA     infection nor to guide or     monitor treatment for     MRSA infections.      Additional labs: 1. INR: 1.48 2. Blood alcohol level on admission <11     Studies: No results found.      Scheduled Meds: . antiseptic oral rinse  15 mL Mouth Rinse q12n4p  . chlorhexidine  15 mL Mouth Rinse BID  . ciprofloxacin  400 mg Intravenous Q12H  . folic acid  1 mg Oral Daily  . multivitamin with minerals  1 tablet Oral Daily  . pantoprazole (PROTONIX) IV  40 mg Intravenous Q12H  . thiamine  100 mg Oral Daily   Continuous Infusions: . octreotide (SANDOSTATIN) infusion 25 mcg/hr (09/17/13 0550)    Active Problems:   Acute blood loss anemia   Liver cirrhosis, alcoholic   Pancytopenia   Acute upper GI bleed    Time spent: 25 minutes    Anum Palecek, MD, FACP, FHM. [facility] Pager [phone]  If  7PM-7AM, please contact night-coverage [email or web address] Password [email or web address] 09/17/2013, 6:06 PM    LOS: 4 days

## 2013-09-18 LAB — CBC
HEMATOCRIT: 26.6 % — AB (ref 39.0–52.0)
Hemoglobin: 8.9 g/dL — ABNORMAL LOW (ref 13.0–17.0)
MCH: 32 pg (ref 26.0–34.0)
MCHC: 33.5 g/dL (ref 30.0–36.0)
MCV: 95.7 fL (ref 78.0–100.0)
Platelets: 60 10*3/uL — ABNORMAL LOW (ref 150–400)
RBC: 2.78 MIL/uL — ABNORMAL LOW (ref 4.22–5.81)
RDW: 17.1 % — ABNORMAL HIGH (ref 11.5–15.5)
WBC: 2.1 10*3/uL — ABNORMAL LOW (ref 4.0–10.5)

## 2013-09-18 MED ORDER — ZOLPIDEM TARTRATE 10 MG PO TABS: 10.0000 mg | ORAL_TABLET | Freq: Once | ORAL | Status: DC

## 2013-09-18 MED ORDER — PROPRANOLOL HCL ER 60 MG PO CP24
60.0000 mg | ORAL_CAPSULE | Freq: Every day | ORAL | Status: DC
  Administered 2013-09-18 – 2013-09-19 (×2): 60 mg via ORAL
  Filled 2013-09-18 (×2): qty 1

## 2013-09-18 MED ORDER — PANTOPRAZOLE SODIUM 40 MG PO TBEC
40.0000 mg | DELAYED_RELEASE_TABLET | Freq: Two times a day (BID) | ORAL | Status: DC
  Administered 2013-09-18 – 2013-09-19 (×2): 40 mg via ORAL
  Filled 2013-09-18 (×4): qty 1

## 2013-09-18 NOTE — Progress Notes (Signed)
[facility] PROGRESS NOTE    Jake [name] Jake [name]:[id number] DOB: [birth year] DOA: 09/13/2013 PCP: Rikki Spearing PHUONG, DO  HPI/Brief narrative 58 y.o active ETOH user with ETOH cirrhosis , known varices, presented with massive variceal UGIB, intubated in ED to enable safe EGD- 1/2. S/p extubation 1/3. Transferred from CCM to Hospitalist's on 1/5.  Assessment/Plan:  Acute upper GI bleeding secondary to esophageal varices/alcoholic cirrhosis/elevated ammonia level due to GI bleeding - Status post EGD on 1/2 after intubation for airway protection. EGD showed esophageal varices. - GI bleeding seems to have clinically abated. On PPI and octreotide drip.  - GI has discontinued octreotide drip and recommend additional 24 hours monitoring in the hospital which patient has reluctantly agreed to. Advance to mechanical soft diet and continue PPI. Inderal started by Dr. Oletta Lamas.  Acute respiratory failure secondary to airway protection for EGD - Status post extubation 1/3. Stable. CCM signed off.  Hemorrhagic shock secondary to GI bleeding - Resolved 1/4. DC IV fluids - Outpatient Inderal on hold.  Acute post hemorrhagic anemia from GI bleed & pancytopenia due to EtOH - Monitor CBCs closely and transfuse for hemoglobin less than or equal to 7 g per DL. - Stable  History of alcohol dependence/anxiety/insomnia - Continue Ativan protocol, thiamine and folate. Some anxiety overnight. No overt withdrawal features. -  Resumed home dose of Xanax when necessary.    Code Status: Full  Family Communication: Discussed with spouse at bedside Disposition Plan: Possible discharge home 1/8   Consultants:  Gastroenterology   Critical care medicine  Procedures:  Right IJ 1/2 >   ETT 1/2 > 1/3  EGD 1/2  Antibiotics: cipro 1/2 >>  Rocephin 1/2 >>    Subjective: No nausea, vomiting or BM in the last 24 hours. Tolerating diet.  Objective: Filed Vitals:   09/17/13 2147 09/18/13 0428  09/18/13 1237 09/18/13 1419  BP: 114/64 113/64 115/62 113/62  Pulse: 85 75 70 89  Temp: 98.9 F (37.2 C) 98.3 F (36.8 C)  98.2 F (36.8 C)  TempSrc: Oral Oral  Oral  Resp: 18 18  18   Height:      Weight:  89.6 kg (197 lb 8.5 oz)    SpO2: 95% 94%  96%    Intake/Output Summary (Last 24 hours) at 09/18/13 1839 Last data filed at 09/18/13 1700  Gross per 24 hour  Intake    660 ml  Output   3325 ml  Net  -2665 ml   Filed Weights   09/16/13 0521 09/16/13 2100 09/18/13 0428  Weight: 93.2 kg (205 lb 7.5 oz) 90.5 kg (199 lb 8.3 oz) 89.6 kg (197 lb 8.5 oz)     Exam:  General exam: comfortable male lying in bed.  Respiratory system: Clear. No increased work of breathing. Cardiovascular system: S1 & S2 heard, RRR. No JVD, murmurs, gallops, clicks or pedal edema.  Gastrointestinal system: Abdomen is nondistended, soft and nontender. Normal bowel sounds heard. Central nervous system: Alert and oriented. No focal neurological deficits. Extremities: Symmetric 5 x 5 power.Not tremulous.   Data Reviewed: Basic Metabolic Panel:  Recent Labs Lab 09/13/13 1410 09/14/13 0600 09/15/13 0500 09/16/13 0520 09/17/13 0520  NA 136* 144 138 140 140  K 4.6 4.3 3.7 3.8 3.7  CL 103 111 103 105 104  CO2 23 23 26 27 26   GLUCOSE 193* 181* 149* 136* 122*  BUN 12 17 11 8  5*  CREATININE 0.53 0.50 0.61 0.67 0.68  CALCIUM 8.0* 7.5* 7.6* 7.7* 7.7*  MG  --   --  1.6  --   --   PHOS  --   --  2.7  --   --    Liver Function Tests:  Recent Labs Lab 09/13/13 1410 09/16/13 0520 09/17/13 0520  AST 65* 83* 70*  ALT 44 47 47  ALKPHOS 202* 171* 210*  BILITOT 2.9* 3.0* 3.2*  PROT 5.8* 4.9* 5.1*  ALBUMIN 2.9* 2.5* 2.5*    Recent Labs Lab 09/13/13 1410  LIPASE 49    Recent Labs Lab 09/13/13 1350  AMMONIA 140*   CBC:  Recent Labs Lab 09/13/13 1350 09/13/13 1642  09/15/13 0500 09/16/13 0520 09/16/13 1715 09/17/13 0520 09/18/13 0403  WBC 9.5 12.8*  < > 3.2* 2.1* 2.9* 2.2* 2.1*   NEUTROABS 7.0 10.6*  --   --   --   --   --   --   HGB 13.1 10.5*  < > 8.3* 7.5* 9.1* 8.4* 8.9*  HCT 39.0 31.8*  < > 24.3* 22.8* 26.6* 24.5* 26.6*  MCV 92.9 94.1  < > 93.8 95.0 94.3 94.2 95.7  PLT 111* 116*  < > 56* 53* 67* 59* 60*  < > = values in this interval not displayed. Cardiac Enzymes: No results found for this basename: CKTOTAL, CKMB, CKMBINDEX, TROPONINI,  in the last 168 hours BNP (last 3 results) No results found for this basename: PROBNP,  in the last 8760 hours CBG: No results found for this basename: GLUCAP,  in the last 168 hours  Recent Results (from the past 240 hour(s))  MRSA PCR SCREENING     Status: None   Collection Time    09/13/13  7:31 PM      Result Value Range Status   MRSA by PCR NEGATIVE  NEGATIVE Final   Comment:            The GeneXpert MRSA Assay (FDA     approved for NASAL specimens     only), is one component of a     comprehensive MRSA colonization     surveillance program. It is not     intended to diagnose MRSA     infection nor to guide or     monitor treatment for     MRSA infections.      Additional labs: 1. INR: 1.48 2. Blood alcohol level on admission <11     Studies: No results found.      Scheduled Meds: . antiseptic oral rinse  15 mL Mouth Rinse q12n4p  . chlorhexidine  15 mL Mouth Rinse BID  . ciprofloxacin  400 mg Intravenous Q12H  . folic acid  1 mg Oral Daily  . multivitamin with minerals  1 tablet Oral Daily  . pantoprazole  40 mg Oral BID AC  . propranolol ER  60 mg Oral Daily  . thiamine  100 mg Oral Daily   Continuous Infusions:    Active Problems:   Acute blood loss anemia   Liver cirrhosis, alcoholic   Pancytopenia   Acute upper GI bleed    Time spent: 25 minutes    HONGALGI,ANAND, MD, FACP, FHM. [facility] Pager [phone]  If 7PM-7AM, please contact night-coverage [email or web address] Password [email or web address] 09/18/2013, 6:39 PM    LOS: 5 days

## 2013-09-18 NOTE — Progress Notes (Signed)
[facility] PROGRESS NOTE Subjective Pt insists on going home no gross bleeding. Needs to chop wood and "take care of things"  Objective: Vital signs in last 24 hours: Temp:  [98.3 F (36.8 C)-98.9 F (37.2 C)] 98.3 F (36.8 C) (01/07 0428) Pulse Rate:  [75-86] 75 (01/07 0428) Resp:  [18] 18 (01/07 0428) BP: (109-114)/(64) 113/64 mmHg (01/07 0428) SpO2:  [94 %-97 %] 94 % (01/07 0428) Weight:  [89.6 kg (197 lb 8.5 oz)] 89.6 kg (197 lb 8.5 oz) (01/07 0428) Last BM Date: 09/16/13  Intake/Output from previous day: 01/06 0701 - 01/07 0700 In: 900 [P.O.:600; I.V.:100; IV Piggyback:200] Out: 2525 [Urine:2525] Intake/Output this shift: Total I/O In: -  Out: 900 [Urine:900]  PE:  Abdomen--nontender  Lab Results:  Recent Labs  09/16/13 0520 09/16/13 1715 09/17/13 0520 09/18/13 0403  WBC 2.1* 2.9* 2.2* 2.1*  HGB 7.5* 9.1* 8.4* 8.9*  HCT 22.8* 26.6* 24.5* 26.6*  PLT 53* 67* 59* 60*   BMET  Recent Labs  09/16/13 0520 09/17/13 0520  NA 140 140  K 3.8 3.7  CL 105 104  CO2 27 26  CREATININE 0.67 0.68   LFT  Recent Labs  09/16/13 0520 09/17/13 0520  PROT 4.9* 5.1*  AST 83* 70*  ALT 47 47  ALKPHOS 171* 210*  BILITOT 3.0* 3.2*   PT/INR No results found for this basename: LABPROT, INR,  in the last 72 hours PANCREAS No results found for this basename: LIPASE,  in the last 72 hours       Studies/Results: No results found.  Medications: I have reviewed the patient's current medications.  Assessment/Plan: 1.Variceal Bleed. Have told pt that we need to watch him for 1 day off of octreotide but dont know if he will stay. Will stop octreotide now and start on Inderal LA 60 mg (he was taking this on admission) Encourage him to stay 1 more day at least.   Kevonte Vanecek JR,Ovila Lepage L 09/18/2013, 11:21 AM

## 2013-09-19 LAB — CBC
HCT: 26.8 % — ABNORMAL LOW (ref 39.0–52.0)
Hemoglobin: 9 g/dL — ABNORMAL LOW (ref 13.0–17.0)
MCH: 32 pg (ref 26.0–34.0)
MCHC: 33.6 g/dL (ref 30.0–36.0)
MCV: 95.4 fL (ref 78.0–100.0)
PLATELETS: 66 10*3/uL — AB (ref 150–400)
RBC: 2.81 MIL/uL — AB (ref 4.22–5.81)
RDW: 17.1 % — ABNORMAL HIGH (ref 11.5–15.5)
WBC: 2.8 10*3/uL — AB (ref 4.0–10.5)

## 2013-09-19 MED ORDER — ALPRAZOLAM 0.5 MG PO TABS: 0.5000 mg | ORAL_TABLET | Freq: Three times a day (TID) | ORAL | Status: DC | PRN

## 2013-09-19 NOTE — Progress Notes (Signed)
[facility] PROGRESS NOTE Subjective No further bleeding off octreotide, stools browm.  Objective: Vital signs in last 24 hours: Temp:  [98.2 F (36.8 C)-98.6 F (37 C)] 98.4 F (36.9 C) (01/08 0459) Pulse Rate:  [66-89] 66 (01/08 0459) Resp:  [18] 18 (01/08 0459) BP: (99-118)/(55-68) 99/55 mmHg (01/08 0459) SpO2:  [96 %-97 %] 96 % (01/08 0459) Weight:  [86.9 kg (191 lb 9.3 oz)] 86.9 kg (191 lb 9.3 oz) (01/08 0459) Last BM Date: 09/18/13  Intake/Output from previous day: 01/07 0701 - 01/08 0700 In: 660 [P.O.:660] Out: 2750 [Urine:2750] Intake/Output this shift: Total I/O In: 240 [P.O.:240] Out: -   PE:  Abdomen--soft nontender  Lab Results:  Recent Labs  09/16/13 1715 09/17/13 0520 09/18/13 0403 09/19/13 0450  WBC 2.9* 2.2* 2.1* 2.8*  HGB 9.1* 8.4* 8.9* 9.0*  HCT 26.6* 24.5* 26.6* 26.8*  PLT 67* 59* 60* 66*   BMET  Recent Labs  09/17/13 0520  NA 140  K 3.7  CL 104  CO2 26  CREATININE 0.68   LFT  Recent Labs  09/17/13 0520  PROT 5.1*  AST 70*  ALT 47  ALKPHOS 210*  BILITOT 3.2*   PT/INR No results found for this basename: LABPROT, INR,  in the last 72 hours PANCREAS No results found for this basename: LIPASE,  in the last 72 hours       Studies/Results: No results found.  Medications: I have reviewed the patient's current medications.  Assessment/Plan: 1. Variceal Bleed. Stable off octreotide  Would send home on low residue diet and Inderal LA 60 mg follow-up with Dr Watt Climes in 2-3 weeks to arranged OP EGD with banding.   Selassie Spatafore JR,Rhilynn Preyer L 09/19/2013, 9:04 AM

## 2013-09-19 NOTE — Discharge Summary (Signed)
Physician Discharge Summary  EMILE KYLLO CZY:[id number] DOB: 1955-10-28 DOA: 09/13/2013  PCP: Leotis Pain, DO  Admit date: 09/13/2013 Discharge date: 09/19/2013  Time spent: Greater than 30 minutes  Recommendations for Outpatient Follow-up:  1. Dr. Rikki Spearing, PCP in 5 days with repeat labs (CBC & CMP). 2. Dr. Clarene Essex, GI in 2-3 weeks. 3. Recommend follow up CXR in 4-6 weeks.  Discharge Diagnoses:  Active Problems:   Acute blood loss anemia   Liver cirrhosis, alcoholic   Pancytopenia   Acute upper GI bleed   Discharge Condition: Improved & Stable  Diet recommendation: Mechanical soft & low residue diet  Filed Weights   09/16/13 2100 09/18/13 0428 09/19/13 0459  Weight: 90.5 kg (199 lb 8.3 oz) 89.6 kg (197 lb 8.5 oz) 86.9 kg (191 lb 9.3 oz)    History of present illness:  58 y.o active ETOH user with ETOH cirrhosis , known varices, presented with massive variceal UGIB, intubated in ED to enable safe EGD- 1/2. S/p extubation 1/3. Transferred from CCM to Hospitalist's on 1/5.  Hospital Course:   Acute upper GI bleeding secondary to esophageal varices/alcoholic cirrhosis/elevated ammonia level due to GI bleeding  - Status post EGD on 1/2 after intubation for airway protection. EGD showed esophageal varices.  - GI bleeding seems to have clinically abated. Was on PPI & Octreotide drips. Octreotide DCed and monitored additional 24 hours. - GI bleed has resolved. GI has cleared for DC on PPI, Inderal, Low residue diet and OP follow up in 2-3 weeks for repeat EGD and banding.   Acute respiratory failure secondary to airway protection for EGD  - Status post extubation 1/3. Stable. CCM signed off.   Hemorrhagic shock secondary to GI bleeding  - Resolved 1/4. DC IV fluids   Acute post hemorrhagic anemia from GI bleed & pancytopenia due to EtOH  - Monitor CBCs closely and transfuse for hemoglobin less than or equal to 7 g per DL.  - Stable   History of alcohol  dependence/anxiety/insomnia  - Treated with Ativan protocol, thiamine and folate. No overt withdrawal features.  - Resumed home dose of Xanax when necessary.  - cessation counseled multiple times. CSW will provide resources prior to DC   Consultants:  Gastroenterology  Critical care medicine  Procedures:  Right IJ 1/2 >  ETT 1/2 > 1/3  EGD 1/2    Discharge Exam:  Complaints:  Brown stools. Tolerating diet  Filed Vitals:   09/18/13 1237 09/18/13 1419 09/18/13 2040 09/19/13 0459  BP: 115/62 113/62 118/68 99/55  Pulse: 70 89 73 66  Temp:  98.2 F (36.8 C) 98.6 F (37 C) 98.4 F (36.9 C)  TempSrc:  Oral Oral Oral  Resp:  18 18 18   Height:      Weight:    86.9 kg (191 lb 9.3 oz)  SpO2:  96% 97% 96%    General exam: comfortable male lying in bed.  Respiratory system: Clear. No increased work of breathing.  Cardiovascular system: S1 & S2 heard, RRR. No JVD, murmurs, gallops, clicks or pedal edema.Tele: SR  Gastrointestinal system: Abdomen is nondistended, soft and nontender. Normal bowel sounds heard.  Central nervous system: Alert and oriented. No focal neurological deficits.  Extremities: Symmetric 5 x 5 power.Not tremulous   Discharge Instructions      Discharge Orders   Future Orders Complete By Expires   Call MD for:  difficulty breathing, headache or visual disturbances  As directed    Call MD for:  extreme fatigue  As directed    Call MD for:  persistant dizziness or light-headedness  As directed    Call MD for:  persistant nausea and vomiting  As directed    Call MD for:  severe uncontrolled pain  As directed    Call MD for:  As directed    Comments:     Vomiting blood or dark material. Blood in stools or black stools.   Discharge instructions  As directed    Comments:     DIET: Mechanical soft & low fibre diet   Increase activity slowly  As directed        Medication List         ALPRAZolam 0.5 MG tablet  Commonly known as:  XANAX  Take 1  tablet (0.5 mg total) by mouth 3 (three) times daily as needed for anxiety or sleep.     ferrous sulfate 325 (65 FE) MG tablet  Take 1 tablet (325 mg total) by mouth 2 (two) times daily.     folic acid 1 MG tablet  Commonly known as:  FOLVITE  Take 1 tablet (1 mg total) by mouth daily.     multivitamin with minerals Tabs tablet  Take 1 tablet by mouth daily.     pantoprazole 40 MG tablet  Commonly known as:  PROTONIX  Take 1 tablet (40 mg total) by mouth 2 (two) times daily before a meal.     propranolol ER 60 MG 24 hr capsule  Commonly known as:  INDERAL LA  Take 1 capsule (60 mg total) by mouth daily.     thiamine 100 MG tablet  Take 1 tablet (100 mg total) by mouth daily.     zolpidem 10 MG tablet  Commonly known as:  AMBIEN  Take 10 mg by mouth at bedtime as needed for sleep.       Follow-up Information   Follow up with LE, THAO [place], DO. Schedule an appointment as soon as possible for a visit in 5 days. (To be seen with repeat labs (CBC & CMP))    Specialty:  Family Medicine   Contact information:   [place] [address] [phone]       Follow up with [facility] E, MD. Schedule an appointment as soon as possible for a visit in 2 weeks.   Specialty:  Gastroenterology   Contact information:   [address], [place] [address] [phone]        The results of significant diagnostics from this hospitalization (including imaging, microbiology, ancillary and laboratory) are listed below for reference.    Significant Diagnostic Studies: Dg Chest Port 1 View  09/14/2013   CLINICAL DATA:  ETT position  EXAM: PORTABLE CHEST - 1 VIEW  COMPARISON:  09/13/2013  FINDINGS: Endotracheal tube ends in the mid thoracic trachea. Right IJ central venous catheter ends at the superior cavoatrial junction.  Normal heart size for technique. Perihilar opacities, morphology favoring atelectasis. No effusion or pneumothorax. No pulmonary edema.   IMPRESSION: 1. Good positioning of endotracheal tube and right IJ catheter. 2. Mildly improved aeration.  Perihilar opacity persist.   Electronically Signed   By: Jorje Guild M.D.   On: 09/14/2013 06:04   Dg Chest Port 1 View  09/13/2013   CLINICAL DATA:  Intubation.  Central line placement.  EXAM: PORTABLE CHEST - 1 VIEW  COMPARISON:  01/16/2013.  FINDINGS: Endotracheal tube tip 2.7 cm above the carina. Right IJ line in  good anatomic position. Poor inspiration with bilateral platelike atelectasis. Bilateral mild pneumonia cannot be excluded. Cardiomegaly with normal pulmonary vascularity noted. No pleural effusion or pneumothorax.  IMPRESSION: 1. Good line and tube positions. 2. Poor inspiration with bilateral platelike areas of atelectasis. Mild areas of infiltrate in both lung fields cannot be excluded. Follow-up chest x-ray suggested.   Electronically Signed   By: Marcello Moores  Register   On: 09/13/2013 17:01    Microbiology: Recent Results (from the past 240 hour(s))  MRSA PCR SCREENING     Status: None   Collection Time    09/13/13  7:31 PM      Result Value Range Status   MRSA by PCR NEGATIVE  NEGATIVE Final   Comment:            The GeneXpert MRSA Assay (FDA     approved for NASAL specimens     only), is one component of a     comprehensive MRSA colonization     surveillance program. It is not     intended to diagnose MRSA     infection nor to guide or     monitor treatment for     MRSA infections.     Labs: Basic Metabolic Panel:  Recent Labs Lab 09/13/13 1410 09/14/13 0600 09/15/13 0500 09/16/13 0520 09/17/13 0520  NA 136* 144 138 140 140  K 4.6 4.3 3.7 3.8 3.7  CL 103 111 103 105 104  CO2 23 23 26 27 26   GLUCOSE 193* 181* 149* 136* 122*  BUN 12 17 11 8  5*  CREATININE 0.53 0.50 0.61 0.67 0.68  CALCIUM 8.0* 7.5* 7.6* 7.7* 7.7*  MG  --   --  1.6  --   --   PHOS  --   --  2.7  --   --    Liver Function Tests:  Recent Labs Lab 09/13/13 1410 09/16/13 0520  09/17/13 0520  AST 65* 83* 70*  ALT 44 47 47  ALKPHOS 202* 171* 210*  BILITOT 2.9* 3.0* 3.2*  PROT 5.8* 4.9* 5.1*  ALBUMIN 2.9* 2.5* 2.5*    Recent Labs Lab 09/13/13 1410  LIPASE 49    Recent Labs Lab 09/13/13 1350  AMMONIA 140*   CBC:  Recent Labs Lab 09/13/13 1350 09/13/13 1642  09/16/13 0520 09/16/13 1715 09/17/13 0520 09/18/13 0403 09/19/13 0450  WBC 9.5 12.8*  < > 2.1* 2.9* 2.2* 2.1* 2.8*  NEUTROABS 7.0 10.6*  --   --   --   --   --   --   HGB 13.1 10.5*  < > 7.5* 9.1* 8.4* 8.9* 9.0*  HCT 39.0 31.8*  < > 22.8* 26.6* 24.5* 26.6* 26.8*  MCV 92.9 94.1  < > 95.0 94.3 94.2 95.7 95.4  PLT 111* 116*  < > 53* 67* 59* 60* 66*  < > = values in this interval not displayed. Cardiac Enzymes: No results found for this basename: CKTOTAL, CKMB, CKMBINDEX, TROPONINI,  in the last 168 hours BNP: BNP (last 3 results) No results found for this basename: PROBNP,  in the last 8760 hours CBG: No results found for this basename: GLUCAP,  in the last 168 hours  Additional labs:  1. INR: 1.48 2. Blood alcohol level on admission <11   Signed:  Vernell Leep, MD, FACP, FHM. [facility] Pager [phone]  If 7PM-7AM, please contact night-coverage [email or web address] Password [facility] 09/19/2013, 12:22 PM

## 2013-09-19 NOTE — Progress Notes (Signed)
Clinical Social Work Department BRIEF PSYCHOSOCIAL ASSESSMENT 09/19/2013  Patient:  Jake [name], Jake [name]     Account Number:  [id number]     [place] date:  09/13/2013  Clinical Social Worker:  Renold Genta  Date/Time:  09/19/2013 01:22 PM  Referred by:  Physician  Date Referred:  09/19/2013 Referred for  Substance Abuse   Other Referral:   Interview type:  Patient Other interview type:    PSYCHOSOCIAL DATA Living Status:  WIFE Admitted from facility:   Level of care:   Primary support name:  Glenard Keesling (wife) h#: [phone] Primary support relationship to patient:  SPOUSE Degree of support available:   good    CURRENT CONCERNS Current Concerns  Post-Acute Placement   Other Concerns:    SOCIAL WORK ASSESSMENT / PLAN CSW received consult that patient is interested in treatment for ETOH abuse.   Assessment/plan status:  Information/Referral to [employer] Other assessment/ plan:   (See SBIRT in Epic under Doc Flowsheets)   Information/referral to community resources:   CSW provided patient with list of Residential & Outpatient treatment centers, as well as updated AA Meeting List.    PATIENT'S/FAMILY'S RESPONSE TO PLAN OF CARE: Patient states that he has looked into Intensive Outpatient @ [facility], and will look into that again but would like to just give AA meetings a try again. CSW provided list and encouraged him to go. Patient states that his wife does not drink and is a good support for him.       Winfred Leeds, [facility] Clinical Social Worker cell #: [phone]

## 2013-09-23 ENCOUNTER — Ambulatory Visit: Payer: Self-pay

## 2013-09-23 ENCOUNTER — Ambulatory Visit: Payer: 59 | Admitting: Family Medicine

## 2013-09-23 ENCOUNTER — Ambulatory Visit: Payer: 59

## 2013-09-23 VITALS — BP 110/60 | HR 76 | Temp 98.7°F | Resp 16 | Ht 68.75 in | Wt 193.4 lb

## 2013-09-23 DIAGNOSIS — D649 Anemia, unspecified: Secondary | ICD-10-CM

## 2013-09-23 DIAGNOSIS — K922 Gastrointestinal hemorrhage, unspecified: Secondary | ICD-10-CM

## 2013-09-23 DIAGNOSIS — G4700 Insomnia, unspecified: Secondary | ICD-10-CM

## 2013-09-23 DIAGNOSIS — F411 Generalized anxiety disorder: Secondary | ICD-10-CM

## 2013-09-23 LAB — COMPREHENSIVE METABOLIC PANEL
Albumin: 3.7 g/dL (ref 3.5–5.2)
Alkaline Phosphatase: 227 U/L — ABNORMAL HIGH (ref 39–117)
BUN: 9 mg/dL (ref 6–23)
CO2: 26 mEq/L (ref 19–32)
Calcium: 8.5 mg/dL (ref 8.4–10.5)
Chloride: 105 mEq/L (ref 96–112)
Glucose, Bld: 80 mg/dL (ref 70–99)
Sodium: 139 mEq/L (ref 135–145)
Total Bilirubin: 2.5 mg/dL — ABNORMAL HIGH (ref 0.3–1.2)
Total Protein: 6.3 g/dL (ref 6.0–8.3)

## 2013-09-23 LAB — POCT CBC
Granulocyte percent: 60.9 %G (ref 37–80)
HCT, POC: 34.4 % — AB (ref 43.5–53.7)
Hemoglobin: 10.4 g/dL — AB (ref 14.1–18.1)
Lymph, poc: 1 (ref 0.6–3.4)
MCH, POC: 30.1 pg (ref 27–31.2)
MCHC: 30.2 g/dL — AB (ref 31.8–35.4)
MCV: 99.4 fL — AB (ref 80–97)
MID (cbc): 0.4 (ref 0–0.9)
MPV: 9.2 fL (ref 0–99.8)
POC Granulocyte: 2.3 (ref 2–6.9)
POC LYMPH PERCENT: 28.2 %L (ref 10–50)
POC MID %: 10.9 % (ref 0–12)
Platelet Count, POC: 98 10*3/uL — AB (ref 142–424)
RBC: 3.46 M/uL — AB (ref 4.69–6.13)
RDW, POC: 17.1 %
WBC: 3.7 10*3/uL — AB (ref 4.6–10.2)

## 2013-09-23 LAB — COMPREHENSIVE METABOLIC PANEL WITH GFR
ALT: 35 U/L (ref 0–53)
AST: 60 U/L — ABNORMAL HIGH (ref 0–37)
Creat: 0.65 mg/dL (ref 0.50–1.35)
Potassium: 4.7 meq/L (ref 3.5–5.3)

## 2013-09-23 MED ORDER — ALPRAZOLAM 0.5 MG PO TABS: 0.5000 mg | ORAL_TABLET | Freq: Three times a day (TID) | ORAL | Status: DC | PRN

## 2013-09-23 MED ORDER — TRAZODONE HCL 100 MG PO TABS: 100.0000 mg | ORAL_TABLET | Freq: Every evening | ORAL | Status: DC | PRN

## 2013-09-23 NOTE — Progress Notes (Signed)
Chief Complaint:  Chief Complaint  Patient presents with  . Follow-up    lab work     HPI: Jake [name] is a 58 y.o. male who is here for  Hospital follow-up for massive acute GIB due to excess alcohol consumption and abuse. He had to be intubated for airway protection. He had a banding procedure done by GI but they were not able to get all the varices. He had pancytopenia. Here for recheck of labs.  Please see discharge summary below from 09/19/2013. Denies bruising, petechiae, GIB, blood in stool or urine, bleeding gums Deneis abd pain, n/v. Has obstained from drinking.  He states he is a binge drinker.  Would like change in [place] because not helping him get more than 4 hours of sleep Xanax was increased in hospital from BID to TID HIs plan is to abstain completely from alcohol, he is a binge drinker and was drinker over 10 beers per day , if he is to start drinking again he will check himself into a substance abuse center for alcoholism.   Recommendations for Outpatient Follow-up:  1. Dr. Rikki Spearing, PCP in 5 days with repeat labs (CBC & CMP). 2. Dr. Clarene Essex, GI in 2-3 weeks. 3. Recommend follow up CXR in 4-6 weeks. Discharge Diagnoses:  Active Problems:  Acute blood loss anemia  Liver cirrhosis, alcoholic  Pancytopenia  Acute upper GI bleed   Past Medical History  Diagnosis Date  . Blood transfusion   . Hypertension   . Anemia   . Anxiety   . Gout   . Esophageal varices 11/29/12    Historical  . Cirrhosis 11/29/12    historical  . GERD (gastroesophageal reflux disease)    Past Surgical History  Procedure Laterality Date  . Colonoscopy      2011-[facility]. Normal results per patient  . Upper gi endoscopy    . Esophagogastroduodenoscopy N/A 11/29/2012    Procedure: ESOPHAGOGASTRODUODENOSCOPY (EGD);  Surgeon: Cleotis Nipper, MD;  Location: [facility];  Service: Endoscopy;  Laterality: N/A;  . Esophagogastroduodenoscopy N/A 01/16/2013    Procedure:  ESOPHAGOGASTRODUODENOSCOPY (EGD);  Surgeon: Jeryl Columbia, MD;  Location: [facility];  Service: Endoscopy;  Laterality: N/A;   ? bedside case  . Esophageal banding N/A 01/16/2013    Procedure: ESOPHAGEAL BANDING;  Surgeon: Jeryl Columbia, MD;  Location: [facility];  Service: Endoscopy;  Laterality: N/A;  . Esophagogastroduodenoscopy N/A 02/07/2013    Procedure: ESOPHAGOGASTRODUODENOSCOPY (EGD);  Surgeon: Jeryl Columbia, MD;  Location: [facility];  Service: Endoscopy;  Laterality: N/A;  . Esophageal banding N/A 02/07/2013    Procedure: ESOPHAGEAL BANDING;  Surgeon: Jeryl Columbia, MD;  Location: [facility];  Service: Endoscopy;  Laterality: N/A;  . Esophagogastroduodenoscopy N/A 09/13/2013    Procedure: ESOPHAGOGASTRODUODENOSCOPY (EGD);  Surgeon: Missy Sabins, MD;  Location: [facility];  Service: Endoscopy;  Laterality: N/A;   History   Social History  . Marital Status: Married    Spouse Name: N/A    Number of Children: N/A  . Years of Education: N/A   Social History Main Topics  . Smoking status: Never Smoker   . Smokeless tobacco: Never Used  . Alcohol Use: 7.2 oz/week    12 Cans of beer per week     Comment: frequent  . Drug Use: No  . Sexual Activity: Yes   Other Topics Concern  . None   Social History Narrative  . None   Family History  Problem Relation Age of Onset  . Hyperlipidemia Mother    No Known Allergies Prior to Admission medications   Medication Sig Start Date End Date Taking? Authorizing Provider  ALPRAZolam Duanne Moron) 0.5 MG tablet Take 1 tablet (0.5 mg total) by mouth 3 (three) times daily as needed for anxiety or sleep. 09/19/13  Yes Modena Jansky, MD  ferrous sulfate 325 (65 FE) MG tablet Take 1 tablet (325 mg total) by mouth 2 (two) times daily. 04/13/13  Yes Thao P Le, DO  folic acid (FOLVITE) 1 MG tablet Take 1 tablet (1 mg total) by mouth daily. 04/13/13  Yes Thao P Le, DO  Multiple Vitamin (MULTIVITAMIN WITH MINERALS) TABS Take 1 tablet by mouth daily.   Yes  Historical Provider, MD  pantoprazole (PROTONIX) 40 MG tablet Take 1 tablet (40 mg total) by mouth 2 (two) times daily before a meal. 04/13/13  Yes Thao P Le, DO  propranolol ER (INDERAL LA) 60 MG 24 hr capsule Take 1 capsule (60 mg total) by mouth daily. 04/13/13  Yes Thao P Le, DO  thiamine 100 MG tablet Take 1 tablet (100 mg total) by mouth daily. 04/13/13  Yes Thao P Le, DO  zolpidem (AMBIEN) 10 MG tablet Take 10 mg by mouth at bedtime as needed for sleep.   Yes Historical Provider, MD     ROS: The patient denies fevers, chills, night sweats, unintentional weight loss, chest pain, palpitations, wheezing, dyspnea on exertion, nausea, vomiting, abdominal pain, dysuria, hematuria, melena, numbness, weakness, or tingling.  All other systems have been reviewed and were otherwise negative with the exception of those mentioned in the HPI and as above.    PHYSICAL EXAM: Filed Vitals:   09/23/13 1028  BP: 110/60  Pulse: 76  Temp: 98.7 F (37.1 C)  Resp: 16   Filed Vitals:   09/23/13 1028  Height: 5' 8.75" (1.746 m)  Weight: 193 lb 6.4 oz (87.726 kg)   Body mass index is 28.78 kg/(m^2).  General: Alert, no acute distress HEENT:  Normocephalic, atraumatic, oropharynx patent. EOMI, PERRLA, no bleeding gums Cardiovascular:  Regular rate and rhythm, no rubs murmurs or gallops.  No Carotid bruits, radial pulse intact. No pedal edema.  Respiratory: Clear to auscultation bilaterally.  No wheezes, rales, or rhonchi.  No cyanosis, no use of accessory musculature GI: No organomegaly, abdomen is soft and non-tender, positive bowel sounds.  No masses. Skin: No rashes. Neurologic: Facial musculature symmetric. Psychiatric: Patient is appropriate throughout our interaction. Lymphatic: No cervical lymphadenopathy Musculoskeletal: Gait intact.   LABS: Results for orders placed in visit on 09/23/13  POCT CBC      Result Value Range   WBC 3.7 (*) 4.6 - 10.2 K/uL   Lymph, poc 1.0  0.6 - 3.4   POC  LYMPH PERCENT 28.2  10 - 50 %L   MID (cbc) 0.4  0 - 0.9   POC MID % 10.9  0 - 12 %M   POC Granulocyte 2.3  2 - 6.9   Granulocyte percent 60.9  37 - 80 %G   RBC 3.46 (*) 4.69 - 6.13 M/uL   Hemoglobin 10.4 (*) 14.1 - 18.1 g/dL   HCT, POC 34.4 (*) 43.5 - 53.7 %   MCV 99.4 (*) 80 - 97 fL   MCH, POC 30.1  27 - 31.2 pg   MCHC 30.2 (*) 31.8 - 35.4 g/dL   RDW, POC 17.1     Platelet Count, POC 98 (*) 142 - 424 K/uL  MPV 9.2  0 - 99.8 fL     EKG/XRAY:   Primary read interpreted by Dr. Marin Comment at [facility]   ASSESSMENT/PLAN: Encounter Diagnoses  Name Primary?  . GI bleed Yes  . Generalized anxiety disorder   . Anemia   . Insomnia     CBC trending upward, HGb went from 9-->10. Plts are also up.  Refilled Xanax 0. 5 mg TID x 6 months Change ambien to trazadone since [place] is not as efficacious for sleep.  He will return in 1 month to get repeat cxr for ? Perihilar opacity on xray? He will make appt with Dr Sarina Ser for GIB and banding   Gross sideeffects, risk and benefits, and alternatives of medications d/w patient. Patient is aware that all medications have potential sideeffects and we are unable to predict every sideeffect or drug-drug interaction that may occur.  LE, Frost, DO 09/23/2013 12:09 PM

## 2013-09-25 ENCOUNTER — Encounter (HOSPITAL_COMMUNITY): Payer: Self-pay | Admitting: Pharmacy Technician

## 2013-09-25 ENCOUNTER — Encounter (HOSPITAL_COMMUNITY): Payer: Self-pay | Admitting: *Deleted

## 2013-10-02 ENCOUNTER — Ambulatory Visit: Payer: 59

## 2013-10-02 ENCOUNTER — Other Ambulatory Visit: Payer: Self-pay | Admitting: Family Medicine

## 2013-10-02 ENCOUNTER — Telehealth: Payer: Self-pay | Admitting: Family Medicine

## 2013-10-02 DIAGNOSIS — R9389 Abnormal findings on diagnostic imaging of other specified body structures: Secondary | ICD-10-CM

## 2013-10-02 NOTE — Telephone Encounter (Signed)
Havig banding procedure done on 1/29, Dr Perley Jain office wants chest xray ealrier than 1 month before banding. He will come in on 10/05/13 to get that done.

## 2013-10-04 ENCOUNTER — Other Ambulatory Visit: Payer: Self-pay | Admitting: Gastroenterology

## 2013-10-04 NOTE — Addendum Note (Signed)
Addended byClarene Essex on: 10/04/2013 04:05 PM   Modules accepted: Orders

## 2013-10-05 ENCOUNTER — Ambulatory Visit (INDEPENDENT_AMBULATORY_CARE_PROVIDER_SITE_OTHER): Payer: 59 | Admitting: Family Medicine

## 2013-10-05 ENCOUNTER — Ambulatory Visit: Payer: 59

## 2013-10-05 VITALS — BP 110/62 | HR 75 | Temp 98.5°F | Resp 16 | Ht 68.5 in | Wt 196.0 lb

## 2013-10-05 DIAGNOSIS — R9389 Abnormal findings on diagnostic imaging of other specified body structures: Secondary | ICD-10-CM

## 2013-10-05 NOTE — Progress Notes (Signed)
Chief Complaint:  Chief Complaint  Patient presents with  . Follow-up    chest x ray    HPI: Jake [name] is a 58 y.o. male who is here for  Recheck of abnormal xray on last hospitalization for GIB. He is plannign to get banding for esophageal varices due to  GIB due to excessive alcohol use  By Dr Watt Climes on 10/10/2013.   Past Medical History  Diagnosis Date  . Blood transfusion Sep 13, 2013  . Hypertension   . Anemia   . Anxiety   . Gout     hx of no recent flare ups  . Esophageal varices 11/29/12    Historical  . Cirrhosis 11/29/12    historical  . GERD (gastroesophageal reflux disease)    Past Surgical History  Procedure Laterality Date  . Colonoscopy      2011-[facility]. Normal results per patient  . Upper gi endoscopy    . Esophagogastroduodenoscopy N/A 11/29/2012    Procedure: ESOPHAGOGASTRODUODENOSCOPY (EGD);  Surgeon: Cleotis Nipper, MD;  Location: [facility];  Service: Endoscopy;  Laterality: N/A;  . Esophagogastroduodenoscopy N/A 01/16/2013    Procedure: ESOPHAGOGASTRODUODENOSCOPY (EGD);  Surgeon: Jeryl Columbia, MD;  Location: [facility];  Service: Endoscopy;  Laterality: N/A;   ? bedside case  . Esophageal banding N/A 01/16/2013    Procedure: ESOPHAGEAL BANDING;  Surgeon: Jeryl Columbia, MD;  Location: [facility];  Service: Endoscopy;  Laterality: N/A;  . Esophagogastroduodenoscopy N/A 02/07/2013    Procedure: ESOPHAGOGASTRODUODENOSCOPY (EGD);  Surgeon: Jeryl Columbia, MD;  Location: [facility];  Service: Endoscopy;  Laterality: N/A;  . Esophageal banding N/A 02/07/2013    Procedure: ESOPHAGEAL BANDING;  Surgeon: Jeryl Columbia, MD;  Location: [facility];  Service: Endoscopy;  Laterality: N/A;  . Esophagogastroduodenoscopy N/A 09/13/2013    Procedure: ESOPHAGOGASTRODUODENOSCOPY (EGD);  Surgeon: Missy Sabins, MD;  Location: [facility];  Service: Endoscopy;  Laterality: N/A;   History   Social History  . Marital Status: Married    Spouse Name: N/A   Number of Children: N/A  . Years of Education: N/A   Social History Main Topics  . Smoking status: Never Smoker   . Smokeless tobacco: Never Used  . Alcohol Use: No     Comment: 6 pack per day for long time quit jan 09-2013  . Drug Use: No  . Sexual Activity: Yes   Other Topics Concern  . None   Social History Narrative  . None   Family History  Problem Relation Age of Onset  . Hyperlipidemia Mother    No Known Allergies Prior to Admission medications   Medication Sig Start Date End Date Taking? Authorizing Provider  ALPRAZolam Duanne Moron) 0.5 MG tablet Take 0.5 mg by mouth 3 (three) times daily as needed for anxiety or sleep. 09/23/13  Yes Thao P Le, DO  ferrous sulfate 325 (65 FE) MG tablet Take 1 tablet (325 mg total) by mouth 2 (two) times daily. 04/13/13  Yes Thao P Le, DO  folic acid (FOLVITE) 1 MG tablet Take 1 tablet (1 mg total) by mouth daily. 04/13/13  Yes Thao P Le, DO  Multiple Vitamin (MULTIVITAMIN WITH MINERALS) TABS Take 1 tablet by mouth daily.   Yes Historical Provider, MD  pantoprazole (PROTONIX) 40 MG tablet Take 1 tablet (40 mg total) by mouth 2 (two) times daily before a meal. 04/13/13  Yes Thao P Le, DO  propranolol ER (INDERAL LA) 60 MG 24 hr capsule  Take 60 mg by mouth at bedtime. 04/13/13  Yes Thao P Le, DO  thiamine 100 MG tablet Take 1 tablet (100 mg total) by mouth daily. 04/13/13  Yes Thao P Le, DO  traZODone (DESYREL) 100 MG tablet Take 100 mg by mouth at bedtime as needed for sleep. 09/23/13   Thao P Le, DO     ROS: The patient denies fevers, chills, night sweats, unintentional weight loss, chest pain, palpitations, wheezing, dyspnea on exertion, nausea, vomiting, abdominal pain, dysuria, hematuria, melena, numbness, weakness, or tingling.   All other systems have been reviewed and were otherwise negative with the exception of those mentioned in the HPI and as above.    PHYSICAL EXAM: Filed Vitals:   10/05/13 1059  BP: 110/62  Pulse: 75  Temp: 98.5 F (36.9  C)  Resp: 16   Filed Vitals:   10/05/13 1059  Height: 5' 8.5" (1.74 m)  Weight: 196 lb (88.905 kg)   Body mass index is 29.36 kg/(m^2).  General: Alert, no acute distress HEENT:  Normocephalic, atraumatic, oropharynx patent. EOMI, PERRLA, no mucosal bleeding Cardiovascular:  Regular rate and rhythm, no rubs murmurs or gallops.  No Carotid bruits, radial pulse intact. No pedal edema.  Respiratory: Clear to auscultation bilaterally.  No wheezes, rales, or rhonchi.  No cyanosis, no use of accessory musculature GI: No organomegaly, abdomen is soft and non-tender, positive bowel sounds.  No masses. Skin: No rashes. Neurologic: Facial musculature symmetric. Psychiatric: Patient is appropriate throughout our interaction. Lymphatic: No cervical lymphadenopathy Musculoskeletal: Gait intact.   LABS: Results for orders placed in visit on 09/23/13  COMPREHENSIVE METABOLIC PANEL      Result Value Range   Sodium 139  135 - 145 mEq/L   Potassium 4.7  3.5 - 5.3 mEq/L   Chloride 105  96 - 112 mEq/L   CO2 26  19 - 32 mEq/L   Glucose, Bld 80  70 - 99 mg/dL   BUN 9  6 - 23 mg/dL   Creat 0.65  0.50 - 1.35 mg/dL   Total Bilirubin 2.5 (*) 0.3 - 1.2 mg/dL   Alkaline Phosphatase 227 (*) 39 - 117 U/L   AST 60 (*) 0 - 37 U/L   ALT 35  0 - 53 U/L   Total Protein 6.3  6.0 - 8.3 g/dL   Albumin 3.7  3.5 - 5.2 g/dL   Calcium 8.5  8.4 - 10.5 mg/dL  POCT CBC      Result Value Range   WBC 3.7 (*) 4.6 - 10.2 K/uL   Lymph, poc 1.0  0.6 - 3.4   POC LYMPH PERCENT 28.2  10 - 50 %L   MID (cbc) 0.4  0 - 0.9   POC MID % 10.9  0 - 12 %M   POC Granulocyte 2.3  2 - 6.9   Granulocyte percent 60.9  37 - 80 %G   RBC 3.46 (*) 4.69 - 6.13 M/uL   Hemoglobin 10.4 (*) 14.1 - 18.1 g/dL   HCT, POC 34.4 (*) 43.5 - 53.7 %   MCV 99.4 (*) 80 - 97 fL   MCH, POC 30.1  27 - 31.2 pg   MCHC 30.2 (*) 31.8 - 35.4 g/dL   RDW, POC 17.1     Platelet Count, POC 98 (*) 142 - 424 K/uL   MPV 9.2  0 - 99.8 fL     EKG/XRAY:     Primary read interpreted by Dr. Marin Comment at [facility]. No acute changes, please  comment on perihilar opacities and scarring  ASSESSMENT/PLAN: Encounter Diagnosis  Name Primary?  . Abnormal x-ray Yes   Will await for officialt xray result but the xray looks to not have any acute processes other than some residual scarring that appears to be chronic Will give trazadone another 2 week trial since he states he is sleeping better, wants to know if this dose will help with depression. I would like him to have the banding done, everything to stabilize alittle bit more before we adjust does or add new medications. He is doing better on trazadone for sleep than he did with Ambien. He will call me in 2 weeks to see how he is doing.  F/u prn  Gross sideeffects, risk and benefits, and alternatives of medications d/w patient. Patient is aware that all medications have potential sideeffects and we are unable to predict every sideeffect or drug-drug interaction that may occur.  Leotis Pain, DO 10/05/2013 11:53 AM   10/06/2013 Called patient about official xray results.  CLINICAL DATA: Followup reported abnormal chest radiograph  EXAM:  CHEST 2 VIEW  COMPARISON: 09/14/2013 at [facility] is the most recent  comparison exam available.  FINDINGS:  Heart size is normal. Thin curvilinear left lower lobe scarring is  noted. Aeration is improved since previously with interval apparent  extubation and removal of right IJ line. No pleural effusion. No new  focal pulmonary opacity.  IMPRESSION:  Left lower lobe scarring. No acute abnormality.

## 2013-10-10 ENCOUNTER — Ambulatory Visit (HOSPITAL_COMMUNITY)
Admission: RE | Admit: 2013-10-10 | Discharge: 2013-10-10 | Disposition: A | Discharge status: Home / Self Care | Payer: 59 | Source: Ambulatory Visit | Attending: Gastroenterology | Admitting: Gastroenterology

## 2013-10-10 ENCOUNTER — Encounter (HOSPITAL_COMMUNITY)
Admission: RE | Disposition: A | Discharge status: Home / Self Care | Payer: Self-pay | Source: Ambulatory Visit | Attending: Gastroenterology

## 2013-10-10 ENCOUNTER — Ambulatory Visit (HOSPITAL_COMMUNITY): Payer: 59 | Admitting: Anesthesiology

## 2013-10-10 ENCOUNTER — Encounter (HOSPITAL_COMMUNITY): Payer: 59 | Admitting: Anesthesiology

## 2013-10-10 ENCOUNTER — Encounter (HOSPITAL_COMMUNITY): Payer: Self-pay

## 2013-10-10 DIAGNOSIS — K7460 Unspecified cirrhosis of liver: Secondary | ICD-10-CM | POA: Insufficient documentation

## 2013-10-10 DIAGNOSIS — Z9119 Patient's noncompliance with other medical treatment and regimen: Secondary | ICD-10-CM | POA: Insufficient documentation

## 2013-10-10 DIAGNOSIS — I8511 Secondary esophageal varices with bleeding: Secondary | ICD-10-CM | POA: Insufficient documentation

## 2013-10-10 DIAGNOSIS — Z91199 Patient's noncompliance with other medical treatment and regimen due to unspecified reason: Secondary | ICD-10-CM | POA: Insufficient documentation

## 2013-10-10 HISTORY — PX: ESOPHAGEAL BANDING: SHX5518

## 2013-10-10 HISTORY — PX: ESOPHAGOGASTRODUODENOSCOPY (EGD) WITH PROPOFOL: SHX5813

## 2013-10-10 SURGERY — ESOPHAGOGASTRODUODENOSCOPY (EGD) WITH PROPOFOL
Anesthesia: Monitor Anesthesia Care

## 2013-10-10 MED ORDER — MIDAZOLAM HCL 2 MG/2ML IJ SOLN
INTRAMUSCULAR | Status: AC
  Filled 2013-10-10: qty 2

## 2013-10-10 MED ORDER — PROPOFOL 10 MG/ML IV BOLUS
INTRAVENOUS | Status: DC | PRN
  Administered 2013-10-10: 40 mg via INTRAVENOUS
  Administered 2013-10-10 (×2): 20 mg via INTRAVENOUS
  Administered 2013-10-10: 50 mg via INTRAVENOUS

## 2013-10-10 MED ORDER — SODIUM CHLORIDE 0.9 % IV SOLN
INTRAVENOUS | Status: DC
Start: 2013-10-10 — End: 2013-10-10
  Administered 2013-10-10: 1000 mL via INTRAVENOUS

## 2013-10-10 MED ORDER — LACTATED RINGERS IV SOLN: INTRAVENOUS | Status: DC

## 2013-10-10 MED ORDER — LACTATED RINGERS IV SOLN
INTRAVENOUS | Status: DC | PRN
  Administered 2013-10-10: 10:00:00 via INTRAVENOUS

## 2013-10-10 MED ORDER — MIDAZOLAM HCL 5 MG/5ML IJ SOLN
INTRAMUSCULAR | Status: DC | PRN
  Administered 2013-10-10 (×2): 1 mg via INTRAVENOUS

## 2013-10-10 MED ORDER — FENTANYL CITRATE 0.05 MG/ML IJ SOLN
INTRAMUSCULAR | Status: DC | PRN
  Administered 2013-10-10 (×2): 50 ug via INTRAVENOUS

## 2013-10-10 MED ORDER — BUTAMBEN-TETRACAINE-BENZOCAINE 2-2-14 % EX AERO
INHALATION_SPRAY | CUTANEOUS | Status: DC | PRN
  Administered 2013-10-10: 2 via TOPICAL

## 2013-10-10 MED ORDER — FENTANYL CITRATE 0.05 MG/ML IJ SOLN
INTRAMUSCULAR | Status: AC
  Filled 2013-10-10: qty 2

## 2013-10-10 MED ORDER — PROPOFOL 10 MG/ML IV BOLUS
INTRAVENOUS | Status: AC
  Filled 2013-10-10: qty 20

## 2013-10-10 SURGICAL SUPPLY — 14 items

## 2013-10-10 NOTE — Progress Notes (Signed)
Jake [name] 10:16 AM  Subjective: Patient doing well since he left the hospital and no new medical complaints and he has not been drinking since discharge  Objective: Vital signs stable afebrile no acute distress exam please see pre-assessment evaluation outpatient labs reviewed  Assessment: Cirrhosis with recurrent variceal bleeding and noncompliance  Plan: Okay to proceed with the EGD probable banding under anesthesia assistance  [facility]

## 2013-10-10 NOTE — Discharge Instructions (Addendum)
Gastrointestinal Endoscopy Care After Refer to this sheet in the next few weeks. These instructions provide you with information on caring for yourself after your procedure. Your caregiver may also give you more specific instructions. Your treatment has been planned according to current medical practices, but problems sometimes occur. Call your caregiver if you have any problems or questions after your procedure. HOME CARE INSTRUCTIONS  If you were given medicine to help you relax (sedative), do not drive, operate machinery, or sign important documents for 24 hours.  Avoid alcohol and hot or warm beverages for the first 24 hours after the procedure.  Only take over-the-counter or prescription medicines for pain, discomfort, or fever as directed by your caregiver. You may resume taking your normal medicines unless your caregiver tells you otherwise. Ask your caregiver when you may resume taking medicines that may cause bleeding, such as aspirin, clopidogrel, or warfarin.  You may return to your normal diet and activities on the day after your procedure, or as directed by your caregiver. Walking may help to reduce any bloated feeling in your abdomen.  Drink enough fluids to keep your urine clear or pale yellow.  You may gargle with salt water if you have a sore throat. SEEK IMMEDIATE MEDICAL CARE IF:  You have severe nausea or vomiting.  You have severe abdominal pain, abdominal cramps that last longer than 6 hours, or abdominal swelling (distention).  You have severe shoulder or back pain.  You have trouble swallowing.  You have shortness of breath, your breathing is shallow, or you are breathing faster than normal.  You have a fever or a rapid heartbeat.  You vomit blood or material that looks like coffee grounds.  You have bloody, black, or tarry stools. MAKE SURE YOU:  Understand these instructions.  Will watch your condition.  Will get help right away if you are not doing  well or get worse. Document Released: 04/12/2004 Document Revised: 02/28/2012 Document Reviewed: 11/29/2011 [facility] Patient Information 2014 [place], [place]. Call if question or problem otherwise soft solid diet for a few days and no aspirin or arthritis pills over-the-counter and no more than 4 Tylenol a day over-the-counter and call to set up a repeat banding in roughly one month and happy to see sooner as needed

## 2013-10-10 NOTE — Op Note (Signed)
[facility] [place], [address]   ENDOSCOPY PROCEDURE REPORT  PATIENT: Jake [name], Jake [name]  MR#: [id number] BIRTHDATE: March 19, 1956 , 40  yrs. old GENDER: Male  ENDOSCOPIST: Clarene Essex, MD REFERRED BY:  PROCEDURE DATE:  10/10/2013 PROCEDURE:   EGD w/ band ligation of varices ASA CLASS:   Class II INDICATIONS:Therapeutic procedure.  history of bleeding esophageal varices MEDICATIONS: propofol (Diprivan) 140mg  IV, Fentanyl 100 mcg IV, and Versed 2 mg IV  TOPICAL ANESTHETIC:used  DESCRIPTION OF PROCEDURE:   After the risks benefits and alternatives of the procedure were thoroughly explained, informed consent was obtained.  The Pentax Gastroscope [id number]  endoscope was introduced through the mouth and advanced to the second portion of the duodenum , limited by Without limitations.   The instrument was slowly withdrawn as the mucosa was fully examined.the findings are recorded below and after a complete endoscopy the scope was removed the banding apparatus was placed and the scope was reinserted and 5bands were placed on 4 different varces in the customary fashion and the patient tolerated the procedure well there was no obvious immediate complication        FINDINGS:1. 4 chains of distal esophageal with one chain 2-3+ and some red spotsstatus post banding as above 2 moderate portal gastropathy 3. Otherwise normal EGD  COMPLICATIONS:none  ENDOSCOPIC IMPRESSION: above   RECOMMENDATIONS:would recommend repeat banding in one month and happy to see back sooner when necessary   REPEAT EXAM: as above   _______________________________ Clarene Essex, MD eSigned:  Clarene Essex, MD 10/10/2013 10:52 AM    CC:  PATIENT NAME:  [name], [name] MR#: [id number]

## 2013-10-10 NOTE — Anesthesia Postprocedure Evaluation (Signed)
  Anesthesia Post-op Note  Patient: Jake [name]  Procedure(s) Performed: Procedure(s) (LRB): ESOPHAGOGASTRODUODENOSCOPY (EGD) WITH PROPOFOL (N/A) ESOPHAGEAL BANDING (N/A)  Patient Location: PACU  Anesthesia Type: MAC  Level of Consciousness: awake and alert   Airway and Oxygen Therapy: Patient Spontanous Breathing  Post-op Pain: mild  Post-op Assessment: Post-op Vital signs reviewed, Patient's Cardiovascular Status Stable, Respiratory Function Stable, Patent Airway and No signs of Nausea or vomiting  Last Vitals:  Filed Vitals:   10/10/13 1049  BP: 123/80  Pulse:   Temp: 36.5 C  Resp:     Post-op Vital Signs: stable   Complications: No apparent anesthesia complications

## 2013-10-10 NOTE — Transfer of Care (Signed)
Immediate Anesthesia Transfer of Care Note  Patient: Jake [name]  Procedure(s) Performed: Procedure(s): ESOPHAGOGASTRODUODENOSCOPY (EGD) WITH PROPOFOL (N/A) ESOPHAGEAL BANDING (N/A)  Patient Location: PACU and Endoscopy Unit  Anesthesia Type:MAC  Level of Consciousness: awake, alert , oriented, patient cooperative and responds to stimulation  Airway & Oxygen Therapy: Patient Spontanous Breathing and Patient connected to nasal cannula oxygen  Post-op Assessment: Report given to PACU RN, Post -op Vital signs reviewed and stable and Patient moving all extremities  Post vital signs: Reviewed and stable  Complications: No apparent anesthesia complications

## 2013-10-10 NOTE — Anesthesia Preprocedure Evaluation (Signed)
Anesthesia Evaluation  Patient identified by MRN, date of birth, ID band Patient awake    Reviewed: Allergy & Precautions, H&P , NPO status , Patient's Chart, lab work & pertinent test results  Airway Mallampati: II TM Distance: >3 FB Neck ROM: Full    Dental no notable dental hx.    Pulmonary neg pulmonary ROS,  breath sounds clear to auscultation  Pulmonary exam normal       Cardiovascular hypertension, Pt. on medications and Pt. on home beta blockers Rhythm:Regular Rate:Normal     Neuro/Psych Anxiety negative neurological ROS     GI/Hepatic negative GI ROS, (+) Cirrhosis -  Esophageal Varices  substance abuse  alcohol use,   Endo/Other  negative endocrine ROS  Renal/GU negative Renal ROS  negative genitourinary   Musculoskeletal negative musculoskeletal ROS (+)   Abdominal   Peds negative pediatric ROS (+)  Hematology  (+) anemia , HIV,   Anesthesia Other Findings   Reproductive/Obstetrics negative OB ROS                           Anesthesia Physical Anesthesia Plan  ASA: III  Anesthesia Plan: MAC   Post-op Pain Management:    Induction: Intravenous  Airway Management Planned: Nasal Cannula  Additional Equipment:   Intra-op Plan:   Post-operative Plan:   Informed Consent: I have reviewed the patients History and Physical, chart, labs and discussed the procedure including the risks, benefits and alternatives for the proposed anesthesia with the patient or authorized representative who has indicated his/her understanding and acceptance.   Dental advisory given  Plan Discussed with: CRNA and Surgeon  Anesthesia Plan Comments:         Anesthesia Quick Evaluation

## 2013-10-10 NOTE — Preoperative (Signed)
Beta Blockers   Reason not to administer Beta Blockers:Not Applicable 

## 2013-10-11 ENCOUNTER — Encounter (HOSPITAL_COMMUNITY): Payer: Self-pay | Admitting: Gastroenterology

## 2013-10-21 ENCOUNTER — Telehealth: Payer: Self-pay

## 2013-10-21 NOTE — Telephone Encounter (Signed)
PT WOULD LIKE TO KNOW IF DR LE WANTED TO INCREASE HIS TRAZODONE, DIDN'T KNOW IF HE HAD TO COME BACK IN. PLEASE CALL [phone]

## 2013-10-22 ENCOUNTER — Other Ambulatory Visit: Payer: Self-pay | Admitting: Family Medicine

## 2013-10-22 ENCOUNTER — Encounter (HOSPITAL_COMMUNITY): Payer: Self-pay | Admitting: *Deleted

## 2013-10-22 NOTE — Telephone Encounter (Signed)
Spoke with pt, he will try increasing to trazadone 150 mg night, this may help with insomnia and depression, he will see me on feb 20 if he can get an appt for fastig labs and med change followup

## 2013-10-23 ENCOUNTER — Encounter (HOSPITAL_COMMUNITY): Payer: Self-pay | Admitting: Pharmacy Technician

## 2013-10-24 ENCOUNTER — Telehealth: Payer: Self-pay | Admitting: Family Medicine

## 2013-10-24 NOTE — Telephone Encounter (Signed)
Made appt. For Feb. 27 with Dr. Marin Comment

## 2013-10-24 NOTE — Telephone Encounter (Signed)
Message copied by Chinita Pester on Thu Oct 24, 2013 12:26 PM ------      Message from: Glenford Bayley      Created: Tue Oct 22, 2013 10:45 AM      Regarding: lab follow-up       Can you make him an appointment for fasting blood work and insomnia follow up on Friday Feb 20 at around 9 am.             Thanks,      Dr. Marin Comment ------

## 2013-11-06 ENCOUNTER — Other Ambulatory Visit: Payer: Self-pay | Admitting: Gastroenterology

## 2013-11-06 NOTE — Addendum Note (Signed)
Addended byClarene Essex on: 11/06/2013 05:03 PM   Modules accepted: Orders

## 2013-11-08 ENCOUNTER — Ambulatory Visit (INDEPENDENT_AMBULATORY_CARE_PROVIDER_SITE_OTHER): Payer: 59 | Admitting: Family Medicine

## 2013-11-08 ENCOUNTER — Ambulatory Visit: Payer: Self-pay | Admitting: Family Medicine

## 2013-11-08 VITALS — BP 108/65 | HR 70 | Temp 98.0°F | Resp 16 | Ht 68.5 in | Wt 193.0 lb

## 2013-11-08 DIAGNOSIS — R748 Abnormal levels of other serum enzymes: Secondary | ICD-10-CM

## 2013-11-08 DIAGNOSIS — E785 Hyperlipidemia, unspecified: Secondary | ICD-10-CM

## 2013-11-08 DIAGNOSIS — G4700 Insomnia, unspecified: Secondary | ICD-10-CM

## 2013-11-08 DIAGNOSIS — K922 Gastrointestinal hemorrhage, unspecified: Secondary | ICD-10-CM

## 2013-11-08 LAB — CBC
HCT: 43.3 % (ref 39.0–52.0)
Hemoglobin: 14.4 g/dL (ref 13.0–17.0)
MCH: 29.6 pg (ref 26.0–34.0)
MCHC: 33.3 g/dL (ref 30.0–36.0)
MCV: 89.1 fL (ref 78.0–100.0)
Platelets: 72 10*3/uL — ABNORMAL LOW (ref 150–400)
RBC: 4.86 MIL/uL (ref 4.22–5.81)
RDW: 16.4 % — ABNORMAL HIGH (ref 11.5–15.5)
WBC: 2.6 10*3/uL — ABNORMAL LOW (ref 4.0–10.5)

## 2013-11-08 LAB — COMPREHENSIVE METABOLIC PANEL
ALT: 25 U/L (ref 0–53)
Albumin: 4.2 g/dL (ref 3.5–5.2)
Alkaline Phosphatase: 137 U/L — ABNORMAL HIGH (ref 39–117)
BUN: 8 mg/dL (ref 6–23)
CO2: 28 mEq/L (ref 19–32)
Calcium: 9.4 mg/dL (ref 8.4–10.5)
Chloride: 101 mEq/L (ref 96–112)
Creat: 0.66 mg/dL (ref 0.50–1.35)
Potassium: 4.3 mEq/L (ref 3.5–5.3)
Sodium: 135 mEq/L (ref 135–145)
Total Protein: 7.3 g/dL (ref 6.0–8.3)

## 2013-11-08 LAB — LIPID PANEL
Cholesterol: 159 mg/dL (ref 0–200)
HDL: 28 mg/dL — ABNORMAL LOW (ref >39)
LDL Cholesterol: 103 mg/dL — ABNORMAL HIGH (ref 0–99)
Total CHOL/HDL Ratio: 5.7 ratio
Triglycerides: 138 mg/dL (ref <150)
VLDL: 28 mg/dL (ref 0–40)

## 2013-11-08 LAB — COMPREHENSIVE METABOLIC PANEL WITH GFR
AST: 53 U/L — ABNORMAL HIGH (ref 0–37)
Glucose, Bld: 123 mg/dL — ABNORMAL HIGH (ref 70–99)
Total Bilirubin: 2.1 mg/dL — ABNORMAL HIGH (ref 0.2–1.2)

## 2013-11-08 LAB — TSH: TSH: 1.545 u[IU]/mL (ref 0.350–4.500)

## 2013-11-08 MED ORDER — TRAZODONE HCL 100 MG PO TABS: ORAL_TABLET | ORAL | Status: DC

## 2013-11-08 NOTE — Progress Notes (Signed)
 Chief Complaint:  Chief Complaint  Patient presents with  . Follow-up    fasting labs and med change  . Medication Refill    trazadone, protonix    HPI: Jake [name] is a 58 y.o. male who is here for  inasomnia follow-up and blood work for GIB, hyperlipidemia.  Marland Kitchen He is to get variceal banding with Dr Watt Climes on 11/12/13  and wants to know if his sxs which are better, will correlate with his blood work, specifically his Hgb and liver enzymes Doing well. No alcohol sicne last hospitalization. History of XOL and wants Lipid panel done as well sicne he is not on any meds.  He has been on 150 mg trazadone for insomnia, he states he sleeps well now but was wondering if he can increase his dose so that he might get into a better mood, he denies worsening depression , anxiety or irritability, he overall is much better but is just curious if a higher doseis better. He deneis libido issues but he is not concerned about that.  He sometimes does get dizzy and tired during the middle of the day, he is on Inderal for ? Portal HTN and HTN control due to varices. He takes all his meds at bedtime except his Xanax, the Inderal makes him tired.   Past Medical History  Diagnosis Date  . Blood transfusion Sep 13, 2013  . Hypertension   . Anemia   . Anxiety   . Gout     hx of no recent flare ups  . Esophageal varices 11/29/12    Historical  . Cirrhosis 11/29/12    historical  . GERD (gastroesophageal reflux disease)    Past Surgical History  Procedure Laterality Date  . Colonoscopy      2011-[facility]. Normal results per patient  . Upper gi endoscopy    . Esophagogastroduodenoscopy N/A 11/29/2012    Procedure: ESOPHAGOGASTRODUODENOSCOPY (EGD);  Surgeon: Cleotis Nipper, MD;  Location: [facility];  Service: Endoscopy;  Laterality: N/A;  . Esophagogastroduodenoscopy N/A 01/16/2013    Procedure: ESOPHAGOGASTRODUODENOSCOPY (EGD);  Surgeon: Jeryl Columbia, MD;  Location: [facility];  Service:  Endoscopy;  Laterality: N/A;   ? bedside case  . Esophageal banding N/A 01/16/2013    Procedure: ESOPHAGEAL BANDING;  Surgeon: Jeryl Columbia, MD;  Location: [facility];  Service: Endoscopy;  Laterality: N/A;  . Esophagogastroduodenoscopy N/A 02/07/2013    Procedure: ESOPHAGOGASTRODUODENOSCOPY (EGD);  Surgeon: Jeryl Columbia, MD;  Location: [facility];  Service: Endoscopy;  Laterality: N/A;  . Esophageal banding N/A 02/07/2013    Procedure: ESOPHAGEAL BANDING;  Surgeon: Jeryl Columbia, MD;  Location: [facility];  Service: Endoscopy;  Laterality: N/A;  . Esophagogastroduodenoscopy N/A 09/13/2013    Procedure: ESOPHAGOGASTRODUODENOSCOPY (EGD);  Surgeon: Missy Sabins, MD;  Location: [facility];  Service: Endoscopy;  Laterality: N/A;  . Esophagogastroduodenoscopy (egd) with propofol N/A 10/10/2013    Procedure: ESOPHAGOGASTRODUODENOSCOPY (EGD) WITH PROPOFOL;  Surgeon: Jeryl Columbia, MD;  Location: [facility];  Service: Endoscopy;  Laterality: N/A;  . Esophageal banding N/A 10/10/2013    Procedure: ESOPHAGEAL BANDING;  Surgeon: Jeryl Columbia, MD;  Location: [facility];  Service: Endoscopy;  Laterality: N/A;   History   Social History  . Marital Status: Married    Spouse Name: N/A    Number of Children: N/A  . Years of Education: N/A   Social History Main Topics  . Smoking status: Never Smoker   . Smokeless tobacco:  Never Used  . Alcohol Use: No     Comment: 6 pack per day for long time quit jan 09-2013  . Drug Use: No  . Sexual Activity: Yes   Other Topics Concern  . Not on file   Social History Narrative  . No narrative on file   Family History  Problem Relation Age of Onset  . Hyperlipidemia Mother    No Known Allergies Prior to Admission medications   Medication Sig Start Date End Date Taking? Authorizing Provider  acetaminophen (TYLENOL) 500 MG tablet Take 500 mg by mouth every 6 (six) hours as needed for moderate pain.   Yes Historical Provider, MD  ALPRAZolam Duanne Moron) 0.5 MG  tablet Take 0.5 mg by mouth 3 (three) times daily as needed for anxiety or sleep. 09/23/13  Yes  P , DO  ferrous sulfate 325 (65 FE) MG tablet Take 1 tablet (325 mg total) by mouth 2 (two) times daily. 04/13/13  Yes  P , DO  Fluorouracil (EFUDEX EX) Apply topically.   Yes Historical Provider, MD  folic acid (FOLVITE) 1 MG tablet Take 1 tablet (1 mg total) by mouth daily. 04/13/13  Yes  P , DO  Multiple Vitamin (MULTIVITAMIN WITH MINERALS) TABS Take 1 tablet by mouth daily.   Yes Historical Provider, MD  pantoprazole (PROTONIX) 40 MG tablet TAKE ONE TABLET BY MOUTH TWICE DAILY BEFORE MEAL(S)   Yes  P , DO  propranolol ER (INDERAL LA) 60 MG 24 hr capsule Take 60 mg by mouth at bedtime. 04/13/13  Yes  P , DO  thiamine 100 MG tablet Take 1 tablet (100 mg total) by mouth daily. 04/13/13  Yes  P , DO  traZODone (DESYREL) 100 MG tablet Take 150 mg by mouth at bedtime.  09/23/13  Yes  P , DO     ROS: The patient denies fevers, chills, night sweats, unintentional weight loss, chest pain, palpitations, wheezing, dyspnea on exertion, nausea, vomiting, abdominal pain, dysuria, hematuria, melena, numbness, weakness, or tingling.   All other systems have been reviewed and were otherwise negative with the exception of those mentioned in the HPI and as above.    PHYSICAL EXAM: Filed Vitals:   11/08/13 1325  BP: 108/65  Pulse: 70  Temp: 98 F (36.7 C)  Resp: 16   Filed Vitals:   11/08/13 1325  Height: 5' 8.5" (1.74 m)  Weight: 193 lb (87.544 kg)   Body mass index is 28.92 kg/(m^2).  General: Alert, no acute distress, pale, thin appearing male, but better than he has looked in sometime HEENT:  Normocephalic, atraumatic, oropharynx patent. EOMI, PERRLA. No petechiae, no bruising or gum bleeding Cardiovascular:  Regular rate and rhythm, no rubs murmurs or gallops.  No Carotid bruits, radial pulse intact. No pedal edema.  Respiratory: Clear to auscultation bilaterally.   No wheezes, rales, or rhonchi.  No cyanosis, no use of accessory musculature GI: No organomegaly, abdomen is soft and non-tender, positive bowel sounds.  No masses. No ascites.  Skin: + AKs on face and forehead. Neurologic: Facial musculature symmetric. Psychiatric: Patient is appropriate throughout our interaction. Lymphatic: No cervical lymphadenopathy Musculoskeletal: Gait intact.   LABS: Results for orders placed in visit on 09/23/13  COMPREHENSIVE METABOLIC PANEL      Result Value Ref Range   Sodium 139  135 - 145 mEq/L   Potassium 4.7  3.5 - 5.3 mEq/L   Chloride 105  96 - 112 mEq/L   CO2 26  19 - 32 mEq/L  Glucose, Bld 80  70 - 99 mg/dL   BUN 9  6 - 23 mg/dL   Creat 0.65  0.50 - 1.35 mg/dL   Total Bilirubin 2.5 (*) 0.3 - 1.2 mg/dL   Alkaline Phosphatase 227 (*) 39 - 117 U/L   AST 60 (*) 0 - 37 U/L   ALT 35  0 - 53 U/L   Total Protein 6.3  6.0 - 8.3 g/dL   Albumin 3.7  3.5 - 5.2 g/dL   Calcium 8.5  8.4 - 10.5 mg/dL  POCT CBC      Result Value Ref Range   WBC 3.7 (*) 4.6 - 10.2 K/uL   Lymph, poc 1.0  0.6 - 3.4   POC LYMPH PERCENT 28.2  10 - 50 %L   MID (cbc) 0.4  0 - 0.9   POC MID % 10.9  0 - 12 %M   POC Granulocyte 2.3  2 - 6.9   Granulocyte percent 60.9  37 - 80 %G   RBC 3.46 (*) 4.69 - 6.13 M/uL   Hemoglobin 10.4 (*) 14.1 - 18.1 g/dL   HCT, POC 34.4 (*) 43.5 - 53.7 %   MCV 99.4 (*) 80 - 97 fL   MCH, POC 30.1  27 - 31.2 pg   MCHC 30.2 (*) 31.8 - 35.4 g/dL   RDW, POC 17.1     Platelet Count, POC 98 (*) 142 - 424 K/uL   MPV 9.2  0 - 99.8 fL     EKG/XRAY:   Primary read interpreted by Dr. Marin Comment at [facility].   ASSESSMENT/PLAN: Encounter Diagnoses  Name Primary?  . Insomnia Yes  . GIB (gastrointestinal bleeding)   . Abnormal liver enzymes   . Other and unspecified hyperlipidemia    CBC and CMP pending Lipid and TSH pending F/u prn with trazadone after speak with Dr Watt Climes. He is to take 150 mg daily not 200 mg until he speaks about propranalol and if he should  be on same dose or lower dose.  I am not sure a higher dose would effect his mood in a positive way, he is ok to wait and see for now.  I do not want him to be oversedated and dizzy, he takse the Inderal at night as well.   Gross sideeffects, risk and benefits, and alternatives of medications d/w patient. Patient is aware that all medications have potential sideeffects and we are unable to predict every sideeffect or drug-drug interaction that may occur.  , [place], DO 11/08/2013 2:06 PM

## 2013-11-11 ENCOUNTER — Encounter (HOSPITAL_COMMUNITY): Payer: Self-pay | Admitting: Certified Registered Nurse Anesthetist

## 2013-11-11 ENCOUNTER — Encounter: Payer: Self-pay | Admitting: Family Medicine

## 2013-11-11 ENCOUNTER — Telehealth: Payer: Self-pay | Admitting: Family Medicine

## 2013-11-11 NOTE — Telephone Encounter (Signed)
Spoke to patient about labs.

## 2013-11-12 ENCOUNTER — Ambulatory Visit (HOSPITAL_COMMUNITY): Admission: RE | Admit: 2013-11-12 | Payer: 59 | Source: Ambulatory Visit | Admitting: Gastroenterology

## 2013-11-12 SURGERY — EGD (ESOPHAGOGASTRODUODENOSCOPY)
Anesthesia: Monitor Anesthesia Care

## 2013-11-13 ENCOUNTER — Telehealth: Payer: Self-pay

## 2013-11-13 NOTE — Telephone Encounter (Addendum)
Dr Marin Comment,  Patient postponed his endoscopy appt to April due to a cold.  Gastrologist won't reduce his indrall until after his appt - it is making him sleeping in the afternoon.   Changed to taking it from bedtime to noontime to see it will improve.   Had low BP when seen Friday.   [phone]

## 2013-11-14 ENCOUNTER — Ambulatory Visit (INDEPENDENT_AMBULATORY_CARE_PROVIDER_SITE_OTHER): Payer: 59 | Admitting: Family Medicine

## 2013-11-14 ENCOUNTER — Ambulatory Visit: Payer: 59

## 2013-11-14 VITALS — BP 110/72 | HR 75 | Temp 98.7°F | Resp 16 | Ht 69.0 in | Wt 189.0 lb

## 2013-11-14 DIAGNOSIS — R0981 Nasal congestion: Secondary | ICD-10-CM

## 2013-11-14 DIAGNOSIS — R05 Cough: Secondary | ICD-10-CM

## 2013-11-14 DIAGNOSIS — R059 Cough, unspecified: Secondary | ICD-10-CM

## 2013-11-14 DIAGNOSIS — J3489 Other specified disorders of nose and nasal sinuses: Secondary | ICD-10-CM

## 2013-11-14 DIAGNOSIS — J988 Other specified respiratory disorders: Secondary | ICD-10-CM

## 2013-11-14 DIAGNOSIS — J22 Unspecified acute lower respiratory infection: Secondary | ICD-10-CM

## 2013-11-14 LAB — POCT CBC
Granulocyte percent: 62.8 %G (ref 37–80)
HCT, POC: 45 % (ref 43.5–53.7)
Hemoglobin: 14.2 g/dL (ref 14.1–18.1)
Lymph, poc: 0.8 (ref 0.6–3.4)
MCH, POC: 29.6 pg (ref 27–31.2)
MCHC: 31.6 g/dL — AB (ref 31.8–35.4)
MCV: 93.8 fL (ref 80–97)
MID (cbc): 0.3 (ref 0–0.9)
MPV: 10.6 fL (ref 0–99.8)
POC Granulocyte: 1.9 — AB (ref 2–6.9)
POC LYMPH PERCENT: 25.8 %L (ref 10–50)
POC MID %: 11.4 %M (ref 0–12)
Platelet Count, POC: 82 10*3/uL — AB (ref 142–424)
RBC: 4.8 M/uL (ref 4.69–6.13)
RDW, POC: 15.8 %
WBC: 3 10*3/uL — AB (ref 4.6–10.2)

## 2013-11-14 MED ORDER — BENZONATATE 100 MG PO CAPS: 100.0000 mg | ORAL_CAPSULE | Freq: Three times a day (TID) | ORAL | Status: DC | PRN

## 2013-11-14 MED ORDER — AZITHROMYCIN 250 MG PO TABS: ORAL_TABLET | ORAL | Status: DC

## 2013-11-14 NOTE — Telephone Encounter (Signed)
Left message for pt to explain his message a little more clearly.

## 2013-11-14 NOTE — Progress Notes (Signed)
   Subjective:    Patient ID: Jake [name], male    DOB: 1956/03/17, 58 y.o.   MRN: [id number]  HPI  58 year old male presents for evaluation of 1 week history of productive cough.  Complains of nasal congestion, sinus pressure, slight sore throat, and fever in the morning. He has taken OTC tylenol which does help with the fever and chills. Symptoms do improve through the day but he continues to feel fatigued. Denies SOB, wheezing, chest pain, headache, dizziness, otalgia, nausea, or vomiting.   Hx of alcoholism - sober since 09/12/13 when he was hospitalized for GI bleed.  Doing well and has no other concerns today.      Review of Systems  Constitutional: Positive for fever (in the morning) and chills.  HENT: Positive for congestion, postnasal drip, rhinorrhea and sinus pressure. Negative for sore throat.   Respiratory: Positive for cough. Negative for chest tightness, shortness of breath and wheezing.   Cardiovascular: Negative for chest pain.  Gastrointestinal: Negative for nausea, vomiting and abdominal pain.  Neurological: Negative for dizziness and headaches.       Objective:   Physical Exam  Constitutional: He is oriented to person, place, and time. He appears well-developed and well-nourished.  HENT:  Head: Normocephalic and atraumatic.  Right Ear: Hearing, tympanic membrane, external ear and ear canal normal.  Left Ear: Hearing, tympanic membrane, external ear and ear canal normal.  Nose: Right sinus exhibits no maxillary sinus tenderness and no frontal sinus tenderness. Left sinus exhibits no maxillary sinus tenderness and no frontal sinus tenderness.  Mouth/Throat: Uvula is midline, oropharynx is clear and moist and mucous membranes are normal.  Eyes: Conjunctivae are normal.  Neck: Normal range of motion. Neck supple.  Cardiovascular: Normal rate, regular rhythm and normal heart sounds.   Pulmonary/Chest: Effort normal and breath sounds normal.  Lymphadenopathy:    He has  no cervical adenopathy.  Neurological: He is oriented to person, place, and time.  Psychiatric: He has a normal mood and affect. His behavior is normal. Judgment and thought content normal.      [facility] reading (PRIMARY) by  Dr. Tamala Julian as left lower lobe scarring unchanged. No other acute abnormalities noted.      Assessment & Plan:   Lower respiratory infection  Cough - Plan: DG Chest 2 View, POCT CBC, azithromycin (ZITHROMAX) 250 MG tablet, benzonatate (TESSALON) 100 MG capsule  Nasal congestion  Will cover with Zpack as directed Tessalon perles tid prn cough Caution against narcotic cough syrup since he is in recovery RTC precautions discussed Follow up if symptoms worsen or fail to improve.

## 2013-11-15 NOTE — Telephone Encounter (Signed)
Spoke to patient, simply wanted to update you, GI scheduled for.  April 1st GI stated for him to stay on same dose of inderal and to take at lunch time. Pt states he is doing well. He postponed due to URI, saw h. Marte yesterday, states he is feeling much better.

## 2013-11-19 ENCOUNTER — Encounter (HOSPITAL_COMMUNITY): Payer: Self-pay | Admitting: Pharmacy Technician

## 2013-11-19 ENCOUNTER — Encounter (HOSPITAL_COMMUNITY): Payer: Self-pay | Admitting: *Deleted

## 2013-11-21 ENCOUNTER — Telehealth: Payer: Self-pay

## 2013-11-21 NOTE — Telephone Encounter (Signed)
Lm for rtn call 

## 2013-11-21 NOTE — Telephone Encounter (Signed)
Pt has been ill for 2 weeks. He is almost out of the tesslon and has finished the z pak. States the cough has lingered. Advised pt to try Delsym cough syrup. He will call us back if he needs anything further or RTC if sx rtn.

## 2013-11-21 NOTE — Telephone Encounter (Signed)
Patient is calling to speak with nurse or assistant about him not feeling much better please call [phone]

## 2013-12-06 ENCOUNTER — Other Ambulatory Visit: Payer: Self-pay | Admitting: Gastroenterology

## 2013-12-06 NOTE — Addendum Note (Signed)
Addended by: Rawn Quiroa on: 12/06/2013 03:18 PM   Modules accepted: Orders  

## 2013-12-11 ENCOUNTER — Encounter (HOSPITAL_COMMUNITY): Payer: 59 | Admitting: Anesthesiology

## 2013-12-11 ENCOUNTER — Encounter (HOSPITAL_COMMUNITY): Payer: Self-pay | Admitting: Anesthesiology

## 2013-12-11 ENCOUNTER — Encounter (HOSPITAL_COMMUNITY)
Admission: RE | Disposition: A | Discharge status: Home / Self Care | Payer: Self-pay | Source: Ambulatory Visit | Attending: Gastroenterology

## 2013-12-11 ENCOUNTER — Ambulatory Visit (HOSPITAL_COMMUNITY): Payer: 59 | Admitting: Anesthesiology

## 2013-12-11 ENCOUNTER — Ambulatory Visit (HOSPITAL_COMMUNITY)
Admission: RE | Admit: 2013-12-11 | Discharge: 2013-12-11 | Disposition: A | Discharge status: Home / Self Care | Payer: 59 | Source: Ambulatory Visit | Attending: Gastroenterology | Admitting: Gastroenterology

## 2013-12-11 DIAGNOSIS — D126 Benign neoplasm of colon, unspecified: Secondary | ICD-10-CM | POA: Insufficient documentation

## 2013-12-11 DIAGNOSIS — D649 Anemia, unspecified: Secondary | ICD-10-CM | POA: Insufficient documentation

## 2013-12-11 DIAGNOSIS — I8511 Secondary esophageal varices with bleeding: Secondary | ICD-10-CM | POA: Insufficient documentation

## 2013-12-11 DIAGNOSIS — Z1211 Encounter for screening for malignant neoplasm of colon: Secondary | ICD-10-CM | POA: Insufficient documentation

## 2013-12-11 DIAGNOSIS — K7460 Unspecified cirrhosis of liver: Secondary | ICD-10-CM | POA: Insufficient documentation

## 2013-12-11 DIAGNOSIS — K219 Gastro-esophageal reflux disease without esophagitis: Secondary | ICD-10-CM | POA: Insufficient documentation

## 2013-12-11 DIAGNOSIS — K319 Disease of stomach and duodenum, unspecified: Secondary | ICD-10-CM | POA: Insufficient documentation

## 2013-12-11 DIAGNOSIS — I10 Essential (primary) hypertension: Secondary | ICD-10-CM | POA: Insufficient documentation

## 2013-12-11 HISTORY — PX: ESOPHAGOGASTRODUODENOSCOPY (EGD) WITH PROPOFOL: SHX5813

## 2013-12-11 HISTORY — PX: COLONOSCOPY WITH PROPOFOL: SHX5780

## 2013-12-11 HISTORY — DX: Acute upper respiratory infection, unspecified: J06.9

## 2013-12-11 HISTORY — PX: ESOPHAGEAL BANDING: SHX5518

## 2013-12-11 SURGERY — ESOPHAGOGASTRODUODENOSCOPY (EGD) WITH PROPOFOL
Anesthesia: Monitor Anesthesia Care

## 2013-12-11 MED ORDER — LACTATED RINGERS IV SOLN
INTRAVENOUS | Status: DC
  Administered 2013-12-11: 1000 mL via INTRAVENOUS

## 2013-12-11 MED ORDER — PROPOFOL 10 MG/ML IV BOLUS
INTRAVENOUS | Status: DC | PRN
  Administered 2013-12-11: 25 mg via INTRAVENOUS
  Administered 2013-12-11: 50 mg via INTRAVENOUS
  Administered 2013-12-11 (×2): 25 mg via INTRAVENOUS
  Administered 2013-12-11 (×2): 50 mg via INTRAVENOUS
  Administered 2013-12-11 (×5): 25 mg via INTRAVENOUS
  Administered 2013-12-11 (×2): 50 mg via INTRAVENOUS

## 2013-12-11 MED ORDER — LACTATED RINGERS IV SOLN
INTRAVENOUS | Status: DC | PRN
  Administered 2013-12-11 (×2): via INTRAVENOUS

## 2013-12-11 MED ORDER — PROPOFOL 10 MG/ML IV BOLUS
INTRAVENOUS | Status: AC
  Filled 2013-12-11: qty 20

## 2013-12-11 MED ORDER — SODIUM CHLORIDE 0.9 % IV SOLN: INTRAVENOUS | Status: DC

## 2013-12-11 MED ORDER — PROMETHAZINE HCL 25 MG/ML IJ SOLN: 6.2500 mg | INTRAMUSCULAR | Status: DC | PRN

## 2013-12-11 MED ORDER — PROPOFOL 10 MG/ML IV BOLUS
INTRAVENOUS | Status: AC
  Filled 2013-12-11: qty 40

## 2013-12-11 SURGICAL SUPPLY — 24 items

## 2013-12-11 NOTE — Discharge Instructions (Addendum)
Call if question or problem otherwise call in one week for biopsy report and to decide if we need to lower his blood pressure medicine and we can schedule a followup in one to 2 months at that time. Liquids only until evening meal and then if doing well soft solids tonight and advance diet tomorrowEsophagogastroduodenoscopy Esophagogastroduodenoscopy (EGD) is a procedure to examine the lining of the esophagus, stomach, and first part of the small intestine (duodenum). A long, flexible, lighted tube with a camera attached (endoscope) is inserted down the throat to view these organs. This procedure is done to detect problems or abnormalities, such as inflammation, bleeding, ulcers, or growths, in order to treat them. The procedure lasts about 5 20 minutes. It is usually an outpatient procedure, but it may need to be performed in emergency cases in the hospital. LET YOUR CAREGIVER KNOW ABOUT:   Allergies to food or medicine.  All medicines you are taking, including vitamins, herbs, eyedrops, and over-the-counter medicines and creams.  Use of steroids (by mouth or creams).  Previous problems you or members of your family have had with the use of anesthetics.  Any blood disorders you have.  Previous surgeries you have had.  Other health problems you have.  Possibility of pregnancy, if this applies. RISKS AND COMPLICATIONS  Generally, EGD is a safe procedure. However, as with any procedure, complications can occur. Possible complications include:  Infection.  Bleeding.  Tearing (perforation) of the esophagus, stomach, or duodenum.  Difficulty breathing or not being able to breath.  Excessive sweating.  Spasms of the larynx.  Slowed heartbeat.  Low blood pressure. BEFORE THE PROCEDURE  Do not eat or drink anything for 6 8 hours before the procedure or as directed by your caregiver.  Ask your caregiver about changing or stopping your regular medicines.  If you wear dentures, be  prepared to remove them before the procedure.  Arrange for someone to drive you home after the procedure. PROCEDURE   A vein will be accessed to give medicines and fluids. A medicine to relax you (sedative) and a pain reliever will be given through that access into the vein.  A numbing medicine (local anesthetic) may be sprayed on your throat for comfort and to stop you from gagging or coughing.  A mouth guard may be placed in your mouth to protect your teeth and to keep you from biting on the endoscope.  You will be asked to lie on your left side.  The endoscope is inserted down your throat and into the esophagus, stomach, and duodenum.  Air is put through the endoscope to allow your caregiver to view the lining of your esophagus clearly.  The esophagus, stomach, and duodenum is then examined. During the exam, your caregiver may:  Remove tissue to be examined under a microscope (biopsy) for inflammation, infection, or other medical problems.  Remove growths.  Remove objects (foreign bodies) that are stuck.  Treat any bleeding with medicines or other devices that stop tissues from bleeding (hot cauters, clipping devices).  Widen (dilate) or stretch narrowed areas of the esophagus and stomach.  The endoscope will then be withdrawn. AFTER THE PROCEDURE  You will be taken to a recovery area to be monitored. You will be able to go home once you are stable and alert.  Do not eat or drink anything until the local anesthetic and numbing medicines have worn off. You may choke.  It is normal to feel bloated, have pain with swallowing, or have a  sore throat for a short time. This will wear off.  Your caregiver should be able to discuss his or her findings with you. It will take longer to discuss the test results if any biopsies were taken. Document Released: 12/30/2004 Document Revised: 08/15/2012 Document Reviewed: 08/01/2012 [facility] Patient Information 2014 [place],  [place]. Colonoscopy, Care After Refer to this sheet in the next few weeks. These instructions provide you with information on caring for yourself after your procedure. Your health care provider may also give you more specific instructions. Your treatment has been planned according to current medical practices, but problems sometimes occur. Call your health care provider if you have any problems or questions after your procedure. WHAT TO EXPECT AFTER THE PROCEDURE  After your procedure, it is typical to have the following:  A small amount of blood in your stool.  Moderate amounts of gas and mild abdominal cramping or bloating. HOME CARE INSTRUCTIONS  Do not drive, operate machinery, or sign important documents for 24 hours.  You may shower and resume your regular physical activities, but move at a slower pace for the first 24 hours.  Take frequent rest periods for the first 24 hours.  Walk around or put a warm pack on your abdomen to help reduce abdominal cramping and bloating.  Drink enough fluids to keep your urine clear or pale yellow.  You may resume your normal diet as instructed by your health care provider. Avoid heavy or fried foods that are hard to digest.  Avoid drinking alcohol for 24 hours or as instructed by your health care provider.  Only take over-the-counter or prescription medicines as directed by your health care provider.  If a tissue sample (biopsy) was taken during your procedure:  Do not take aspirin or blood thinners for 7 days, or as instructed by your health care provider.  Do not drink alcohol for 7 days, or as instructed by your health care provider.  Eat soft foods for the first 24 hours. SEEK MEDICAL CARE IF: You have persistent spotting of blood in your stool 2 3 days after the procedure. SEEK IMMEDIATE MEDICAL CARE IF:  You have more than a small spotting of blood in your stool.  You pass large blood clots in your stool.  Your abdomen is swollen  (distended).  You have nausea or vomiting.  You have a fever.  You have increasing abdominal pain that is not relieved with medicine. Document Released: 04/12/2004 Document Revised: 06/19/2013 Document Reviewed: 05/06/2013 [facility] Patient Information 2014 [place].

## 2013-12-11 NOTE — Op Note (Signed)
[facility] [place], [address]   COLONOSCOPY PROCEDURE REPORT  PATIENT: Jake [name], Jake [name]  MR#: [id number] BIRTHDATE: [birth year] , 79  yrs. old GENDER: Male ENDOSCOPIST: Clarene Essex, MD REFERRED BY: PROCEDURE DATE:  12/11/2013 PROCEDURE:   Colonoscopy with biopsy and snare polypectomy ASA CLASS:   Class II INDICATIONS:Colorectal cancer screening and Rectal Bleeding. MEDICATIONS: propofol (Diprivan) 275mg  IV  DESCRIPTION OF PROCEDURE:   After the risks benefits and alternatives of the procedure were thoroughly explained, informed consent was obtained.  The Pentax Ped Colon [id number]  endoscope was introduced through the anus and advanced to the terminal ileum which was intubated for a short distance , limited by No adverse events experienced.   The quality of the prep was adequate. .  The instrument was then slowly withdrawn as the colon was fully examined.the findings are recorded below the patient tolerated the procedure well there was no obvious immediate complication       FINDINGS: 1. Internal/external small hemorrhoids with dilated rectal veins 2. Few sigmoid and hepatic flexure diverticula 3. Small proximal sigmoid polyp status post hot snare 4. 2 small sessile transverse polyp cold biopsy 5. Otherwise within normal limits to the terminal ileum  COMPLICATIONS: none  IMPRESSION:  above  RECOMMENDATIONS: await pathology but probably repeat colon in 5 years and proceed with endoscopy and possible banding   _______________________________ eSigned:  Clarene Essex, MD 12/11/2013 10:10 AM   CC:  PATIENT NAME:  Jake [name], Jake [name] MR#: [id number]

## 2013-12-11 NOTE — Anesthesia Preprocedure Evaluation (Signed)
Anesthesia Evaluation  Patient identified by MRN, date of birth, ID band Patient awake    Reviewed: Allergy & Precautions, H&P , NPO status , Patient's Chart, lab work & pertinent test results  Airway Mallampati: II TM Distance: >3 FB Neck ROM: Full    Dental no notable dental hx.    Pulmonary neg pulmonary ROS,  breath sounds clear to auscultation  Pulmonary exam normal       Cardiovascular Exercise Tolerance: Good hypertension, Pt. on medications and Pt. on home beta blockers negative cardio ROS  Rhythm:Regular Rate:Normal     Neuro/Psych PSYCHIATRIC DISORDERS Anxiety negative neurological ROS     GI/Hepatic GERD-  Medicated,(+) Cirrhosis -  Esophageal Varices     ,   Endo/Other  negative endocrine ROS  Renal/GU negative Renal ROS  negative genitourinary   Musculoskeletal negative musculoskeletal ROS (+)   Abdominal   Peds negative pediatric ROS (+)  Hematology  (+) anemia ,   Anesthesia Other Findings   Reproductive/Obstetrics negative OB ROS                           Anesthesia Physical Anesthesia Plan  ASA: III  Anesthesia Plan: MAC   Post-op Pain Management:    Induction: Intravenous  Airway Management Planned:   Additional Equipment:   Intra-op Plan:   Post-operative Plan:   Informed Consent: I have reviewed the patients History and Physical, chart, labs and discussed the procedure including the risks, benefits and alternatives for the proposed anesthesia with the patient or authorized representative who has indicated his/her understanding and acceptance.   Dental advisory given  Plan Discussed with: CRNA  Anesthesia Plan Comments:         Anesthesia Quick Evaluation

## 2013-12-11 NOTE — Progress Notes (Signed)
Jake [name] 8:53 AM  Subjective: Other than recurrent bronchitis versus cold patient has done well without signs of bleeding since I last saw him however he thinks his Inderal makes him sluggish but has no other complaint  Objective: Vital signs stable afebrile no acute distress physical exam please see pre-assessment evaluation  Assessment: Cirrhosis and recurrent variceal bleeding also due for colonic screening  Plan: Okay to proceed with colonoscopy and endoscopy and possible repeat banding with anesthesia assistance  [facility]

## 2013-12-11 NOTE — Anesthesia Postprocedure Evaluation (Signed)
  Anesthesia Post-op Note  Patient: Jake [name]  Procedure(s) Performed: Procedure(s) (LRB): ESOPHAGOGASTRODUODENOSCOPY (EGD) WITH PROPOFOL (N/A) ESOPHAGEAL BANDING (N/A) COLONOSCOPY WITH PROPOFOL (N/A)  Patient Location: PACU  Anesthesia Type: MAC  Level of Consciousness: awake and alert   Airway and Oxygen Therapy: Patient Spontanous Breathing  Post-op Pain: mild  Post-op Assessment: Post-op Vital signs reviewed, Patient's Cardiovascular Status Stable, Respiratory Function Stable, Patent Airway and No signs of Nausea or vomiting  Last Vitals:  Filed Vitals:   12/11/13 1115  BP: 122/47  Pulse:   Temp:   Resp: 16    Post-op Vital Signs: stable   Complications: No apparent anesthesia complications

## 2013-12-11 NOTE — Transfer of Care (Signed)
Immediate Anesthesia Transfer of Care Note  Patient: Jake [name]  Procedure(s) Performed: Procedure(s): ESOPHAGOGASTRODUODENOSCOPY (EGD) WITH PROPOFOL (N/A) ESOPHAGEAL BANDING (N/A) COLONOSCOPY WITH PROPOFOL (N/A)  Patient Location: PACU  Anesthesia Type:MAC  Level of Consciousness: awake, sedated and patient cooperative  Airway & Oxygen Therapy: Patient Spontanous Breathing and Patient connected to face mask oxygen  Post-op Assessment: Report given to PACU RN and Post -op Vital signs reviewed and stable  Post vital signs: Reviewed and stable  Complications: No apparent anesthesia complications

## 2013-12-11 NOTE — Op Note (Signed)
[facility] [place], [address]   ENDOSCOPY PROCEDURE REPORT  PATIENT: Jake [name], Jake [name]  MR#: [id number] BIRTHDATE: Dec 22, 1955 , 74  yrs. old GENDER: Male  ENDOSCOPIST: Clarene Essex, MD REFERRED BY:  PROCEDURE DATE:  12/11/2013 PROCEDURE:   EGD w/ band ligation of varices ASA CLASS:   Class II INDICATIONS:Therapeutic procedure.  MEDICATIONS: propofol (Diprivan) 175mg  IV  TOPICAL ANESTHETIC:no  DESCRIPTION OF PROCEDURE:   After the risks benefits and alternatives of the procedure were thoroughly explained, informed consent was obtained.  The Pentax Gastroscope [id number]  endoscope was introduced through the mouth and advanced to the third portion of the duodenum , limited by Without limitations.   The instrument was slowly withdrawn as the mucosa was fully examined.the findings are recorded below and the patient tolerated the procedure well there was no obvious immediate complication        FINDINGS:1. 3-4 trace distal esophageal varices with scarring from previous banding status post 4 bands placed at the end of the procedure 2. One questionable small cardia varices 3. Mild proximal portal gastropathy 4 otherwise within normal limits to the third part of the duodenum  COMPLICATIONS:none  ENDOSCOPIC IMPRESSION:above   RECOMMENDATIONS:slowly advance diet call me when necessary lower Inderal dose to see if he feels better and followup in 2-3 monthsand repeat endoscopy as below   REPEAT EXAM: 3-6 months   _______________________________ Clarene Essex, MD eSigned:  Clarene Essex, MD 12/11/2013 10:17 AM    CC:  PATIENT NAME:  Jake [name], Jake [name] MR#: [id number]

## 2013-12-12 ENCOUNTER — Encounter (HOSPITAL_COMMUNITY): Payer: Self-pay | Admitting: Gastroenterology

## 2014-02-12 ENCOUNTER — Other Ambulatory Visit: Payer: Self-pay | Admitting: Family Medicine

## 2014-02-14 ENCOUNTER — Ambulatory Visit (INDEPENDENT_AMBULATORY_CARE_PROVIDER_SITE_OTHER): Payer: 59 | Admitting: Family Medicine

## 2014-02-14 VITALS — BP 108/74 | HR 67 | Temp 98.0°F | Resp 16 | Ht 68.0 in | Wt 192.8 lb

## 2014-02-14 DIAGNOSIS — J029 Acute pharyngitis, unspecified: Secondary | ICD-10-CM

## 2014-02-14 DIAGNOSIS — H6120 Impacted cerumen, unspecified ear: Secondary | ICD-10-CM

## 2014-02-14 DIAGNOSIS — H9201 Otalgia, right ear: Secondary | ICD-10-CM

## 2014-02-14 DIAGNOSIS — H9209 Otalgia, unspecified ear: Secondary | ICD-10-CM

## 2014-02-14 LAB — POCT RAPID STREP A (OFFICE): Rapid Strep A Screen: NEGATIVE

## 2014-02-14 NOTE — Patient Instructions (Signed)
Good to see you today.  Your ears are now cleared out. Try some OTC zyrtec as needed for your sore throat and other allergy issues.   Let me know if you do not feel back to normal soon

## 2014-02-14 NOTE — Progress Notes (Signed)
[facility] [address] [place] [address] [phone]  Date:  02/14/2014   Name:  Jake [name]   DOB:  Dec 14, 1955   MRN:  [id number]  PCP:  Leotis Pain, DO    Chief Complaint: Sore Throat and Otalgia   History of Present Illness:  Jake [name] is a 58 y.o. very pleasant male patient who presents with the following:  He has noted a problem with his ears for a few months.  They feel clogged.  He has tried some home wax removers, but cannot get the wax out.  It is hard for him to hear.  "its like I'm in a barrell."  He also notes some pain in his throat for the next few days.    Patient Active Problem List   Diagnosis Date Noted  . Acute upper GI bleed 09/13/2013  . UGI bleed 09/13/2013  . Acute blood loss anemia 11/29/2012  . Liver cirrhosis, alcoholic 40/97/3532  . Pancytopenia 11/29/2012  . Acute GI bleeding 11/28/2012  . Generalized anxiety disorder 11/28/2012  . Insomnia 11/28/2012    Past Medical History  Diagnosis Date  . Blood transfusion Sep 13, 2013  . Hypertension   . Anemia   . Anxiety   . Gout     hx of no recent flare ups  . Esophageal varices 11/29/12    Historical  . Cirrhosis 11/29/12    historical  . GERD (gastroesophageal reflux disease)   . Upper respiratory infection     finished antibiotic 11-18-2013, coughing up clear sputum, no temperature    Past Surgical History  Procedure Laterality Date  . Colonoscopy      2011-[facility]. Normal results per patient  . Upper gi endoscopy    . Esophagogastroduodenoscopy N/A 11/29/2012    Procedure: ESOPHAGOGASTRODUODENOSCOPY (EGD);  Surgeon: Cleotis Nipper, MD;  Location: [facility];  Service: Endoscopy;  Laterality: N/A;  . Esophagogastroduodenoscopy N/A 01/16/2013    Procedure: ESOPHAGOGASTRODUODENOSCOPY (EGD);  Surgeon: Jeryl Columbia, MD;  Location: [facility];  Service: Endoscopy;  Laterality: N/A;   ? bedside case  . Esophageal banding N/A 01/16/2013    Procedure:  ESOPHAGEAL BANDING;  Surgeon: Jeryl Columbia, MD;  Location: [facility];  Service: Endoscopy;  Laterality: N/A;  . Esophagogastroduodenoscopy N/A 02/07/2013    Procedure: ESOPHAGOGASTRODUODENOSCOPY (EGD);  Surgeon: Jeryl Columbia, MD;  Location: [facility];  Service: Endoscopy;  Laterality: N/A;  . Esophageal banding N/A 02/07/2013    Procedure: ESOPHAGEAL BANDING;  Surgeon: Jeryl Columbia, MD;  Location: [facility];  Service: Endoscopy;  Laterality: N/A;  . Esophagogastroduodenoscopy N/A 09/13/2013    Procedure: ESOPHAGOGASTRODUODENOSCOPY (EGD);  Surgeon: Missy Sabins, MD;  Location: [facility];  Service: Endoscopy;  Laterality: N/A;  . Esophagogastroduodenoscopy (egd) with propofol N/A 10/10/2013    Procedure: ESOPHAGOGASTRODUODENOSCOPY (EGD) WITH PROPOFOL;  Surgeon: Jeryl Columbia, MD;  Location: [facility];  Service: Endoscopy;  Laterality: N/A;  . Esophageal banding N/A 10/10/2013    Procedure: ESOPHAGEAL BANDING;  Surgeon: Jeryl Columbia, MD;  Location: [facility];  Service: Endoscopy;  Laterality: N/A;  . Esophagogastroduodenoscopy (egd) with propofol N/A 12/11/2013    Procedure: ESOPHAGOGASTRODUODENOSCOPY (EGD) WITH PROPOFOL;  Surgeon: Jeryl Columbia, MD;  Location: [facility];  Service: Endoscopy;  Laterality: N/A;  . Esophageal banding N/A 12/11/2013    Procedure: ESOPHAGEAL BANDING;  Surgeon: Jeryl Columbia, MD;  Location: [facility];  Service: Endoscopy;  Laterality: N/A;  . Colonoscopy with propofol  N/A 12/11/2013    Procedure: COLONOSCOPY WITH PROPOFOL;  Surgeon: Jeryl Columbia, MD;  Location: [facility];  Service: Endoscopy;  Laterality: N/A;    History  Substance Use Topics  . Smoking status: Never Smoker   . Smokeless tobacco: Never Used  . Alcohol Use: No     Comment: 6 pack per day for long time quit jan 09-2013    Family History  Problem Relation Age of Onset  . Hyperlipidemia Mother     No Known Allergies  Medication list has been reviewed and updated.  Current  Outpatient Prescriptions on File Prior to Visit  Medication Sig Dispense Refill  . acetaminophen (TYLENOL) 500 MG tablet Take 500 mg by mouth every 6 (six) hours as needed for moderate pain.      Marland Kitchen ALPRAZolam (XANAX) 0.5 MG tablet Take 0.5 mg by mouth 3 (three) times daily as needed for anxiety or sleep.      . ferrous sulfate 325 (65 FE) MG tablet Take 1 tablet (325 mg total) by mouth 2 (two) times daily.  60 tablet  5  . folic acid (FOLVITE) 1 MG tablet TAKE ONE TABLET BY MOUTH ONCE DAILY  30 tablet  2  . Multiple Vitamin (MULTIVITAMIN WITH MINERALS) TABS Take 1 tablet by mouth daily.      . pantoprazole (PROTONIX) 40 MG tablet Take 40 mg by mouth 2 (two) times daily.      Marland Kitchen thiamine 100 MG tablet Take 1 tablet (100 mg total) by mouth daily.  30 tablet  5  . benzonatate (TESSALON) 100 MG capsule Take 1-2 capsules (100-200 mg total) by mouth 3 (three) times daily as needed for cough.  40 capsule  0  . fluorouracil (EFUDEX) 5 % cream Apply 1 application topically daily as needed. For pre cancer spots      . propranolol ER (INDERAL LA) 60 MG 24 hr capsule Take 60 mg by mouth at bedtime.      . traZODone (DESYREL) 100 MG tablet Take 150 mg by mouth at bedtime.       No current facility-administered medications on file prior to visit.    Review of Systems:  As per HPI- otherwise negative.   Physical Examination: Filed Vitals:   02/14/14 0830  BP: 108/74  Pulse: 67  Temp: 98 F (36.7 C)  Resp: 16   Filed Vitals:   02/14/14 0830  Height: 5\' 8"  (1.727 m)  Weight: 192 lb 12.8 oz (87.454 kg)   Body mass index is 29.32 kg/(m^2). Ideal Body Weight: Weight in (lb) to have BMI = 25: 164.1  GEN: WDWN, NAD, Non-toxic, A & O x 3, looks well HEENT: Atraumatic, Normocephalic. Neck supple. No masses, No LAD.  Cerumen impaction bilaterally.  Oropharynx and nasal cavity wnl  Ears and Nose: No external deformity. CV: RRR, No M/G/R. No JVD. No thrill. No extra heart sounds. PULM: CTA B, no  wheezes, crackles, rhonchi. No retractions. No resp. distress. No accessory muscle use. ABD: S, NT, ND, +BS. No rebound. No HSM. EXTR: No c/c/e NEURO Normal gait.  PSYCH: Normally interactive. Conversant. Not depressed or anxious appearing.  Calm demeanor.   Results for orders placed in visit on 02/14/14  POCT RAPID STREP A (OFFICE)      Result Value Ref Range   Rapid Strep A Screen Negative  Negative    Cerumen impaction resolved with irrigation of both ears.  Normal ear canals and TM after Assessment and Plan: Cerumen impaction  Acute  pharyngitis - Plan: POCT rapid strep A  Ear pain, right  Cerumen impaction resolved.  He may want to do this on a scheduled periodic basis  likely viral or allergic ST.  Zyrtec as needed Let me know if not better soon- Sooner if worse.   Signed Lamar Blinks, MD

## 2014-04-07 ENCOUNTER — Other Ambulatory Visit: Payer: Self-pay | Admitting: Family Medicine

## 2014-04-07 ENCOUNTER — Telehealth: Payer: Self-pay

## 2014-04-07 MED ORDER — ALPRAZOLAM 0.5 MG PO TABS: 0.5000 mg | ORAL_TABLET | Freq: Three times a day (TID) | ORAL | Status: DC | PRN

## 2014-04-07 NOTE — Telephone Encounter (Signed)
PT STATES HE IS OUT OF TOWN UNTIL THE MIDDLE OF September AND WILL BE OUT OF HIS [place], WOULD LIKE TO HAVE ENOUGH UNTIL THEN. PLEASE CALL [phone]     [facility] AT [phone]

## 2014-04-07 NOTE — Telephone Encounter (Addendum)
Dr Marin Comment, see RF request enc in your in basket w/Rx pended and correct pharmacy.   I phoned it in to the pharmacy so you can shred the fax copy. Thanks Dr Marin Comment 04/07/14 @ 7:03

## 2014-04-07 NOTE — Telephone Encounter (Signed)
Dr Marin Comment, please advise it you want to RF and # of RFs wanted.

## 2014-04-07 NOTE — Telephone Encounter (Signed)
Local [facility] called and asked that if we approve Rx they would like [place] to send to the [facility] in [place] where pt is currently working so that they can transfer any RFs back here when he returns mid -Sept. The store # in [place] is [phone]. Set phar in Rx req.

## 2014-04-11 ENCOUNTER — Telehealth: Payer: Self-pay

## 2014-04-11 NOTE — Telephone Encounter (Signed)
[facility] sent req for RF of trazadone. LMOM for pt to CB. According to 04/07/14, pt is going to be out of town until middle of Sept and we just sent his alprazolam to a [facility] out of town. Does he want this RF to [facility] or to the [facility]?

## 2014-04-11 NOTE — Telephone Encounter (Signed)
Also have reqs for folic acid, protonix and propranolol from [facility].

## 2014-04-12 ENCOUNTER — Telehealth: Payer: Self-pay

## 2014-04-12 NOTE — Telephone Encounter (Signed)
PATIENT WANTS TO HAVE RX SENT TO [facility].  ALSO WANTS TO LET us KNOW THAT HE IS WORKING OUT OF TOWN UNTIL THE END OF September, AND WOULD LIKE TO RTC FOR LABS AT THAT TIME

## 2014-04-14 MED ORDER — PANTOPRAZOLE SODIUM 40 MG PO TBEC: 40.0000 mg | DELAYED_RELEASE_TABLET | Freq: Two times a day (BID) | ORAL | Status: DC

## 2014-04-14 MED ORDER — PROPRANOLOL HCL ER 60 MG PO CP24: 60.0000 mg | ORAL_CAPSULE | Freq: Every day | ORAL | Status: DC

## 2014-04-14 MED ORDER — FOLIC ACID 1 MG PO TABS: ORAL_TABLET | ORAL | Status: DC

## 2014-04-14 MED ORDER — TRAZODONE HCL 100 MG PO TABS: ORAL_TABLET | ORAL | Status: DC

## 2014-04-14 NOTE — Telephone Encounter (Addendum)
I realized that pt had called back and another message was started. Called him back to tell him to disregard mes to CB and pt answered phone and confirmed all 4 Rxs below to be sent to [facility]. Pt agreed to RTC when he returns to town end of Sept. Sent in RFs.

## 2014-04-14 NOTE — Telephone Encounter (Signed)
LMOM to CB. 

## 2014-04-14 NOTE — Telephone Encounter (Signed)
See prev message, duplicate.

## 2014-05-22 IMAGING — DX DG CHEST 1V PORT
1 series · 1 of 1 positions shown · non-contrast
Comparison: 01/16/2013.

CLINICAL DATA: Intubation.  Central line placement.

EXAM:
PORTABLE CHEST - 1 VIEW

[AP]
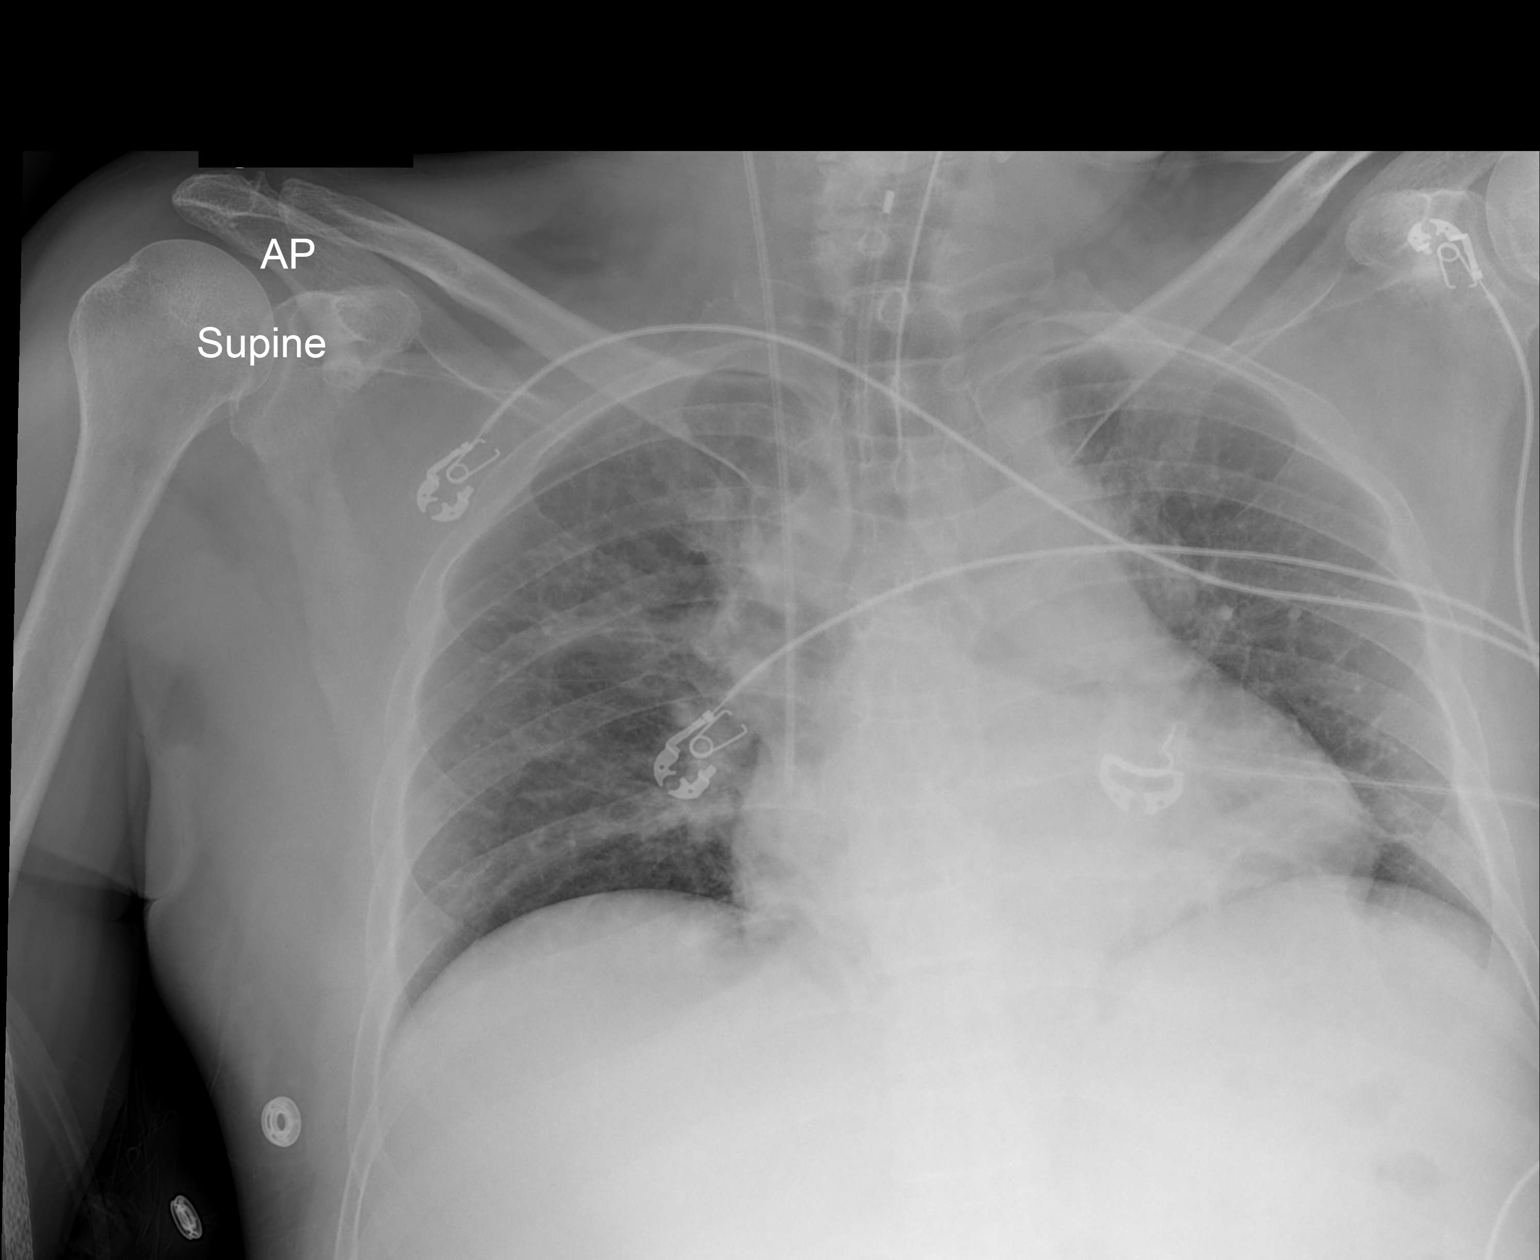

[1 of 1 positions shown; findings below may reference images not displayed]

FINDINGS: Endotracheal tube tip 2.7 cm above the carina. Right IJ line in good
anatomic position. Poor inspiration with bilateral platelike
atelectasis. Bilateral mild pneumonia cannot be excluded.
Cardiomegaly with normal pulmonary vascularity noted. No pleural
effusion or pneumothorax.
IMPRESSION: 1. Good line and tube positions.
2. Poor inspiration with bilateral platelike areas of atelectasis.
Mild areas of infiltrate in both lung fields cannot be excluded.
Follow-up chest x-ray suggested.

## 2014-06-07 ENCOUNTER — Other Ambulatory Visit: Payer: Self-pay | Admitting: Family Medicine

## 2014-06-08 ENCOUNTER — Ambulatory Visit (INDEPENDENT_AMBULATORY_CARE_PROVIDER_SITE_OTHER): Payer: 59 | Admitting: Family Medicine

## 2014-06-08 VITALS — BP 96/62 | HR 65 | Temp 97.8°F | Resp 16 | Ht 69.0 in | Wt 188.8 lb

## 2014-06-08 DIAGNOSIS — Z23 Encounter for immunization: Secondary | ICD-10-CM

## 2014-06-08 DIAGNOSIS — G4700 Insomnia, unspecified: Secondary | ICD-10-CM

## 2014-06-08 DIAGNOSIS — F411 Generalized anxiety disorder: Secondary | ICD-10-CM

## 2014-06-08 DIAGNOSIS — R21 Rash and other nonspecific skin eruption: Secondary | ICD-10-CM

## 2014-06-08 DIAGNOSIS — D649 Anemia, unspecified: Secondary | ICD-10-CM

## 2014-06-08 DIAGNOSIS — R748 Abnormal levels of other serum enzymes: Secondary | ICD-10-CM

## 2014-06-08 LAB — POCT CBC
Granulocyte percent: 70.7 %G (ref 37–80)
HCT, POC: 51.2 % (ref 43.5–53.7)
Hemoglobin: 16.6 g/dL (ref 14.1–18.1)
Lymph, poc: 1.2 (ref 0.6–3.4)
MCH, POC: 32.3 pg — AB (ref 27–31.2)
MCHC: 32.5 g/dL (ref 31.8–35.4)
MCV: 99.5 fL — AB (ref 80–97)
MID (cbc): 0.6 (ref 0–0.9)
MPV: 9.4 fL (ref 0–99.8)
POC Granulocyte: 4.3 (ref 2–6.9)
POC LYMPH PERCENT: 20.2 %L (ref 10–50)
POC MID %: 9.1 %M (ref 0–12)
Platelet Count, POC: 94 10*3/uL — AB (ref 142–424)
RBC: 5.15 M/uL (ref 4.69–6.13)
RDW, POC: 17.3 %
WBC: 6.1 10*3/uL (ref 4.6–10.2)

## 2014-06-08 LAB — COMPLETE METABOLIC PANEL WITHOUT GFR
AST: 72 U/L — ABNORMAL HIGH (ref 0–37)
Albumin: 3.8 g/dL (ref 3.5–5.2)
BUN: 8 mg/dL (ref 6–23)
Calcium: 9.5 mg/dL (ref 8.4–10.5)
Chloride: 102 meq/L (ref 96–112)
Creat: 0.75 mg/dL (ref 0.50–1.35)
GFR, Est Non African American: >89 mL/min
Glucose, Bld: 79 mg/dL (ref 70–99)

## 2014-06-08 LAB — COMPLETE METABOLIC PANEL WITH GFR
ALT: 38 U/L (ref 0–53)
Alkaline Phosphatase: 220 U/L — ABNORMAL HIGH (ref 39–117)
CO2: 29 mEq/L (ref 19–32)
GFR, Est African American: >89 mL/min
Potassium: 4.7 mEq/L (ref 3.5–5.3)
Sodium: 137 mEq/L (ref 135–145)
Total Bilirubin: 5.4 mg/dL — ABNORMAL HIGH (ref 0.2–1.2)
Total Protein: 7.4 g/dL (ref 6.0–8.3)

## 2014-06-08 MED ORDER — ALPRAZOLAM 0.5 MG PO TABS: 0.5000 mg | ORAL_TABLET | Freq: Three times a day (TID) | ORAL | Status: DC | PRN

## 2014-06-08 MED ORDER — ZOLPIDEM TARTRATE 10 MG PO TABS: 10.0000 mg | ORAL_TABLET | Freq: Every evening | ORAL | Status: DC | PRN

## 2014-06-08 MED ORDER — SULFAMETHOXAZOLE-TMP DS 800-160 MG PO TABS: 1.0000 | ORAL_TABLET | Freq: Two times a day (BID) | ORAL | Status: DC

## 2014-06-08 NOTE — Progress Notes (Signed)
Chief Complaint:  Chief Complaint  Patient presents with  . Medication Refill    HPI: Jake [name] is a 58 y.o. 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 He has a history of esophageal varices due to etoh abuse.  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 He was on ambein before and was able to sleep through the night and overall felt better . The increase dose does not help his mood. /stress His mood has been off, he does not want to be addicted to the xanax and has been tapering it down but it helps him with his mood when he does take it  He had a rash in [place], it was dx as MRSA but no cx was done, rash was from dirty and sweaty cap, he was given fungal meds and also Septra and took it and was doing better, he was also given bactroban oitment in case it recurred in small patches. He state while taking oral meds he was improving but it has returned.  He states it is better than it was.   Past Medical History  Diagnosis Date  . Blood transfusion Sep 13, 2013  . Hypertension   . Anemia   . Anxiety   . Gout     hx of no recent flare ups  . Esophageal varices 11/29/12    Historical  . Cirrhosis 11/29/12    historical  . GERD (gastroesophageal reflux disease)   . Upper respiratory infection     finished antibiotic 11-18-2013, coughing up clear sputum, no temperature   Past Surgical History  Procedure  Laterality Date  . Colonoscopy      2011-[facility]. Normal results per patient  . Upper gi endoscopy    . Esophagogastroduodenoscopy N/A 11/29/2012    Procedure: ESOPHAGOGASTRODUODENOSCOPY (EGD);  Surgeon: Cleotis Nipper, MD;  Location: [facility];  Service: Endoscopy;  Laterality: N/A;  . Esophagogastroduodenoscopy N/A 01/16/2013    Procedure: ESOPHAGOGASTRODUODENOSCOPY (EGD);  Surgeon: Jeryl Columbia, MD;  Location: [facility];  Service: Endoscopy;  Laterality: N/A;   ? bedside case  . Esophageal banding N/A 01/16/2013    Procedure: ESOPHAGEAL BANDING;  Surgeon: Jeryl Columbia, MD;  Location: [facility];  Service: Endoscopy;  Laterality: N/A;  . Esophagogastroduodenoscopy N/A 02/07/2013    Procedure: ESOPHAGOGASTRODUODENOSCOPY (EGD);  Surgeon: Jeryl Columbia, MD;  Location: [facility];  Service: Endoscopy;  Laterality: N/A;  . Esophageal banding N/A 02/07/2013    Procedure: ESOPHAGEAL BANDING;  Surgeon: Jeryl Columbia, MD;  Location: [facility];  Service: Endoscopy;  Laterality: N/A;  . Esophagogastroduodenoscopy N/A 09/13/2013    Procedure: ESOPHAGOGASTRODUODENOSCOPY (EGD);  Surgeon: Missy Sabins, MD;  Location: [facility];  Service: Endoscopy;  Laterality: N/A;  . Esophagogastroduodenoscopy (egd) with propofol N/A 10/10/2013    Procedure: ESOPHAGOGASTRODUODENOSCOPY (  EGD) WITH PROPOFOL;  Surgeon: Jeryl Columbia, MD;  Location: [facility];  Service: Endoscopy;  Laterality: N/A;  . Esophageal banding N/A 10/10/2013    Procedure: ESOPHAGEAL BANDING;  Surgeon: Jeryl Columbia, MD;  Location: [facility];  Service: Endoscopy;  Laterality: N/A;  . Esophagogastroduodenoscopy (egd) with propofol N/A 12/11/2013    Procedure: ESOPHAGOGASTRODUODENOSCOPY (EGD) WITH PROPOFOL;  Surgeon: Jeryl Columbia, MD;  Location: [facility];  Service: Endoscopy;  Laterality: N/A;  . Esophageal banding N/A 12/11/2013    Procedure: ESOPHAGEAL BANDING;  Surgeon: Jeryl Columbia, MD;  Location: [facility];  Service: Endoscopy;   Laterality: N/A;  . Colonoscopy with propofol N/A 12/11/2013    Procedure: COLONOSCOPY WITH PROPOFOL;  Surgeon: Jeryl Columbia, MD;  Location: [facility];  Service: Endoscopy;  Laterality: N/A;   History   Social History  . Marital Status: Married    Spouse Name: N/A    Number of Children: N/A  . Years of Education: N/A   Social History Main Topics  . Smoking status: Former Research scientist (life sciences)  . Smokeless tobacco: Never Used  . Alcohol Use: No     Comment: 6 pack per day for long time quit jan 09-2013  . Drug Use: No  . Sexual Activity: Yes   Other Topics Concern  . None   Social History Narrative  . None   Family History  Problem Relation Age of Onset  . Hyperlipidemia Mother    No Known Allergies Prior to Admission medications   Medication Sig Start Date End Date Taking? Authorizing Provider  acetaminophen (TYLENOL) 500 MG tablet Take 500 mg by mouth every 6 (six) hours as needed for moderate pain.   Yes Historical Provider, MD  ALPRAZolam Duanne Moron) 0.5 MG tablet Take 1 tablet (0.5 mg total) by mouth 3 (three) times daily as needed for anxiety or sleep. Prescription phoned in by Dr Marin Comment  To [phone] 04/07/14  Yes Layna Roeper P Stiven Kaspar, DO  ferrous sulfate 325 (65 FE) MG tablet Take 1 tablet (325 mg total) by mouth 2 (two) times daily. 04/13/13  Yes Kianni Lheureux P Azyiah Bo, DO  folic acid (FOLVITE) 1 MG tablet TAKE ONE TABLET BY MOUTH ONCE DAILY 04/14/14  Yes Iasia Forcier P Abigail Marsiglia, DO  Multiple Vitamin (MULTIVITAMIN WITH MINERALS) TABS Take 1 tablet by mouth daily.   Yes Historical Provider, MD  mupirocin ointment (BACTROBAN) 2 % Place 1 application into the nose 2 (two) times daily.   Yes Historical Provider, MD  pantoprazole (PROTONIX) 40 MG tablet Take 1 tablet (40 mg total) by mouth 2 (two) times daily. 04/14/14  Yes Taeveon Keesling P Gaynor Ferreras, DO  propranolol ER (INDERAL LA) 60 MG 24 hr capsule Take 1 capsule (60 mg total) by mouth at bedtime. 04/14/14  Yes Raela Bohl P Ellagrace Yoshida, DO  thiamine 100 MG tablet Take 1 tablet (100 mg total) by mouth daily. 04/13/13   Yes Kayzlee Wirtanen P Janet Decesare, DO  traZODone (DESYREL) 100 MG tablet Take 1.5 to 2 tablets by mouth at bedtime as needed for insomnia. 04/14/14  Yes Keaun Schnabel P Giovannina Mun, DO  fluorouracil (EFUDEX) 5 % cream Apply 1 application topically daily as needed. For pre cancer spots    Historical Provider, MD     ROS: The patient denies fevers, chills, night sweats, unintentional weight loss, chest pain, palpitations, wheezing, dyspnea on exertion, nausea, vomiting, abdominal pain, dysuria, hematuria, melena, numbness, weakness, or tingling.   All other systems have been reviewed and were otherwise negative with the exception of those mentioned in the HPI  and as above.    PHYSICAL EXAM: Filed Vitals:   06/08/14 1136  BP: 96/62  Pulse: 65  Temp: 97.8 F (36.6 C)  Resp: 16   Filed Vitals:   06/08/14 1136  Height: 5\' 9"  (1.753 m)  Weight: 188 lb 12.8 oz (85.639 kg)   Body mass index is 27.87 kg/(m^2).  General: Alert, no acute distress HEENT:  Normocephalic, atraumatic, oropharynx patent. EOMI, PERRLA. TM nl , no gum bleeding Cardiovascular:  Regular rate and rhythm, no rubs murmurs or gallops.  No Carotid bruits, radial pulse intact. No pedal edema.  Respiratory: Clear to auscultation bilaterally.  No wheezes, rales, or rhonchi.  No cyanosis, no use of accessory musculature GI: No organomegaly, abdomen is soft and non-tender, positive bowel sounds.  No masses. NOnsidetended, no ascites no caput medusa Skin: + strep/staph rashes on scalp, honey crusted nonweeping diffuse rash, scabs , o discahrge Neurologic: Facial musculature symmetric. Neg for tremors, memory is WNL Psychiatric: Patient is appropriate throughout our interaction. Lymphatic: No cervical lymphadenopathy Musculoskeletal: Gait intact.   LABS: Results for orders placed in visit on 06/08/14  POCT CBC      Result Value Ref Range   WBC 6.1  4.6 - 10.2 K/uL   Lymph, poc 1.2  0.6 - 3.4   POC LYMPH PERCENT 20.2  10 - 50 %L   MID (cbc) 0.6  0 - 0.9   POC  MID % 9.1  0 - 12 %M   POC Granulocyte 4.3  2 - 6.9   Granulocyte percent 70.7  37 - 80 %G   RBC 5.15  4.69 - 6.13 M/uL   Hemoglobin 16.6  14.1 - 18.1 g/dL   HCT, POC 51.2  43.5 - 53.7 %   MCV 99.5 (*) 80 - 97 fL   MCH, POC 32.3 (*) 27 - 31.2 pg   MCHC 32.5  31.8 - 35.4 g/dL   RDW, POC 17.3     Platelet Count, POC 94 (*) 142 - 424 K/uL   MPV 9.4  0 - 99.8 fL     EKG/XRAY:   Primary read interpreted by Dr. Marin Comment at [facility].   ASSESSMENT/PLAN: Encounter Diagnoses  Name Primary?  . Rash and nonspecific skin eruption Yes  . Generalized anxiety disorder   . Insomnia   . Anemia, unspecified anemia type   . Abnormal liver enzymes    Jake [name] is doing well. He has a past medical  hx of cirrhosis, esophageal varices and GIB and anemia requiring hospitalization.  He is followed by Dr Sarina Ser for his GI conditions but has not been back to see him-he is need of another variceal banding . Encourage him to go see Dr Watt Climes CBC is stable today, no e.o anemia. He will be given septra for his rash, it supposedly better per patietn , he may need an extended course.  I will go ahead and ask him to taper down his trazodone, 1/2 tab for 1 week and he can take ambien prn for sleep. He will be on ambien for insomnia once he is doen with the taper.  Refilled his other chronic meds Labs pending He will call for refills of meds, f/u in 3-6 months or prn.    Gross sideeffects, risk and benefits, and alternatives of medications d/w patient. Patient is aware that all medications have potential sideeffects and we are unable to predict every sideeffect or drug-drug interaction that may occur.  Areana Kosanke, [place], DO 06/08/2014 1:25 PM

## 2014-06-09 NOTE — Telephone Encounter (Signed)
Dr Marin Comment you just saw pt for med refills but don't see these discussed. Can we RF? Last lipid test 10/2013.

## 2014-06-13 IMAGING — CR DG CHEST 2V
2 series · 2 of 2 positions shown · non-contrast
Comparison: 09/14/2013 at [HOSPITAL] is the most recent
comparison exam available.

CLINICAL DATA: Followup reported abnormal chest radiograph

EXAM:
CHEST  2 VIEW

[PA]
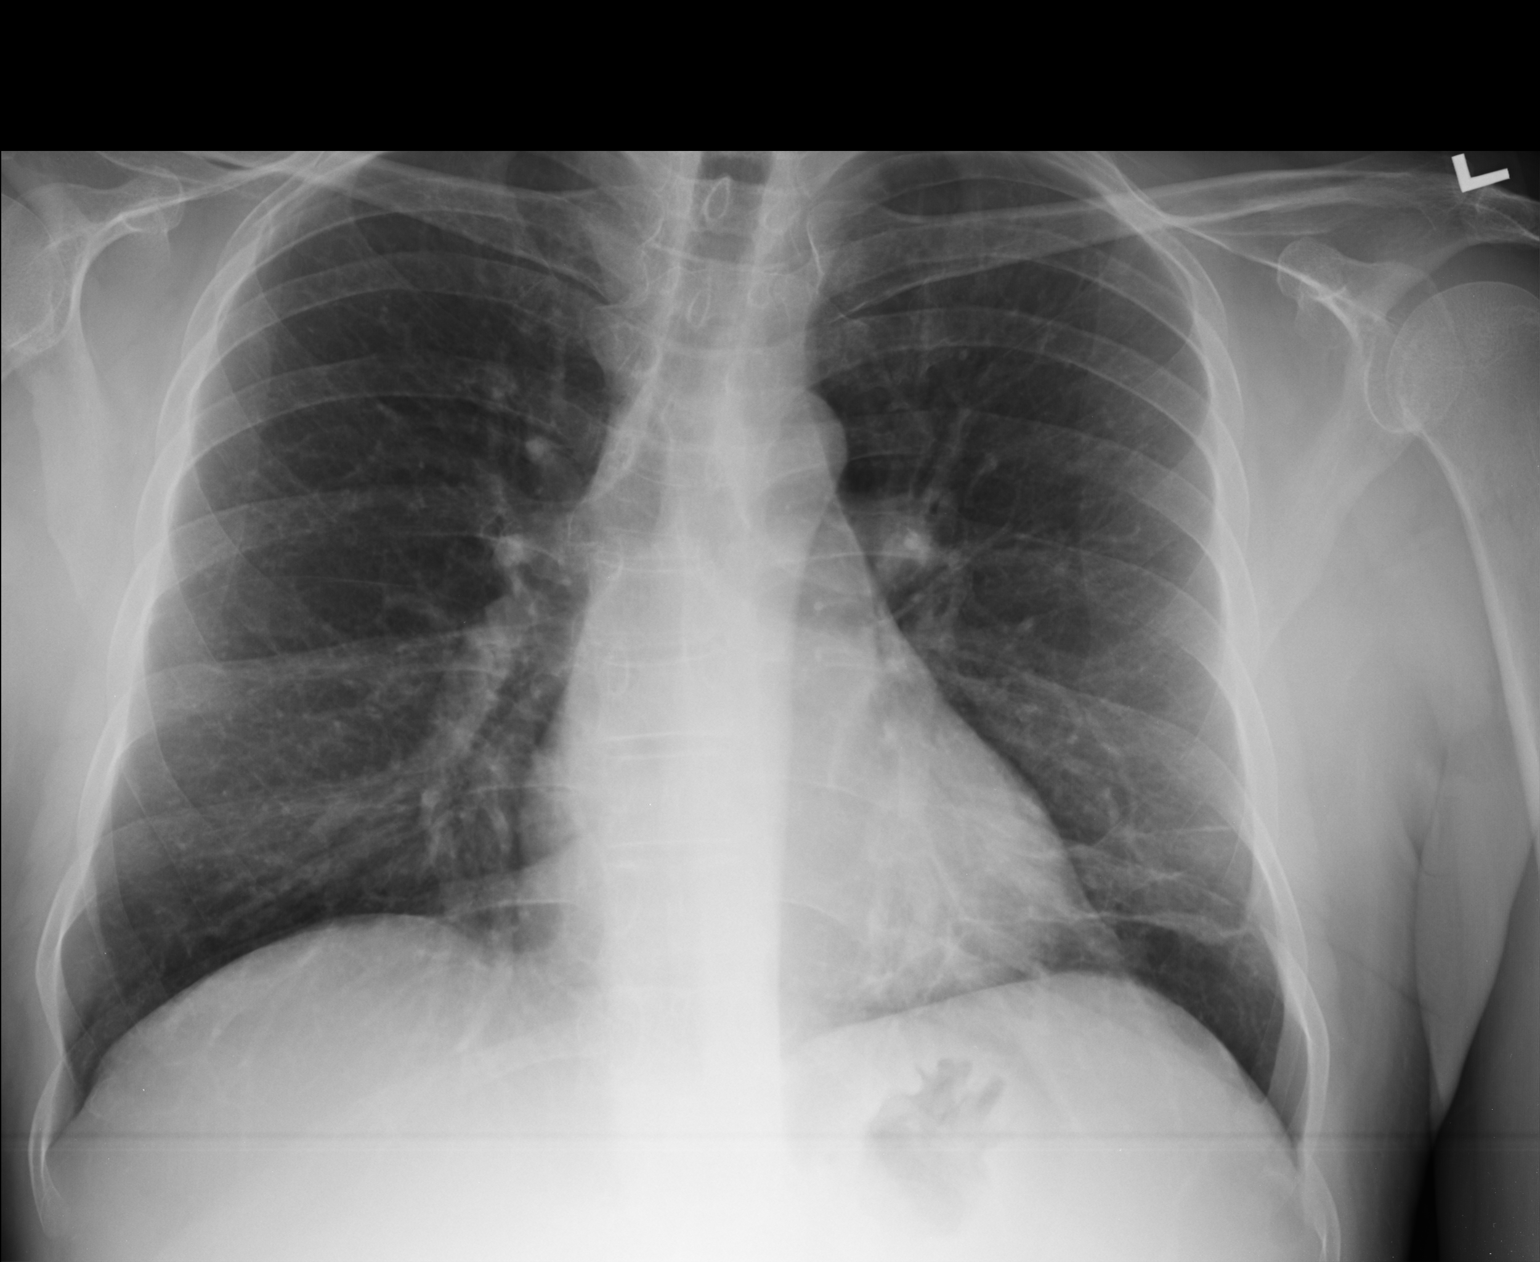

[lateral]
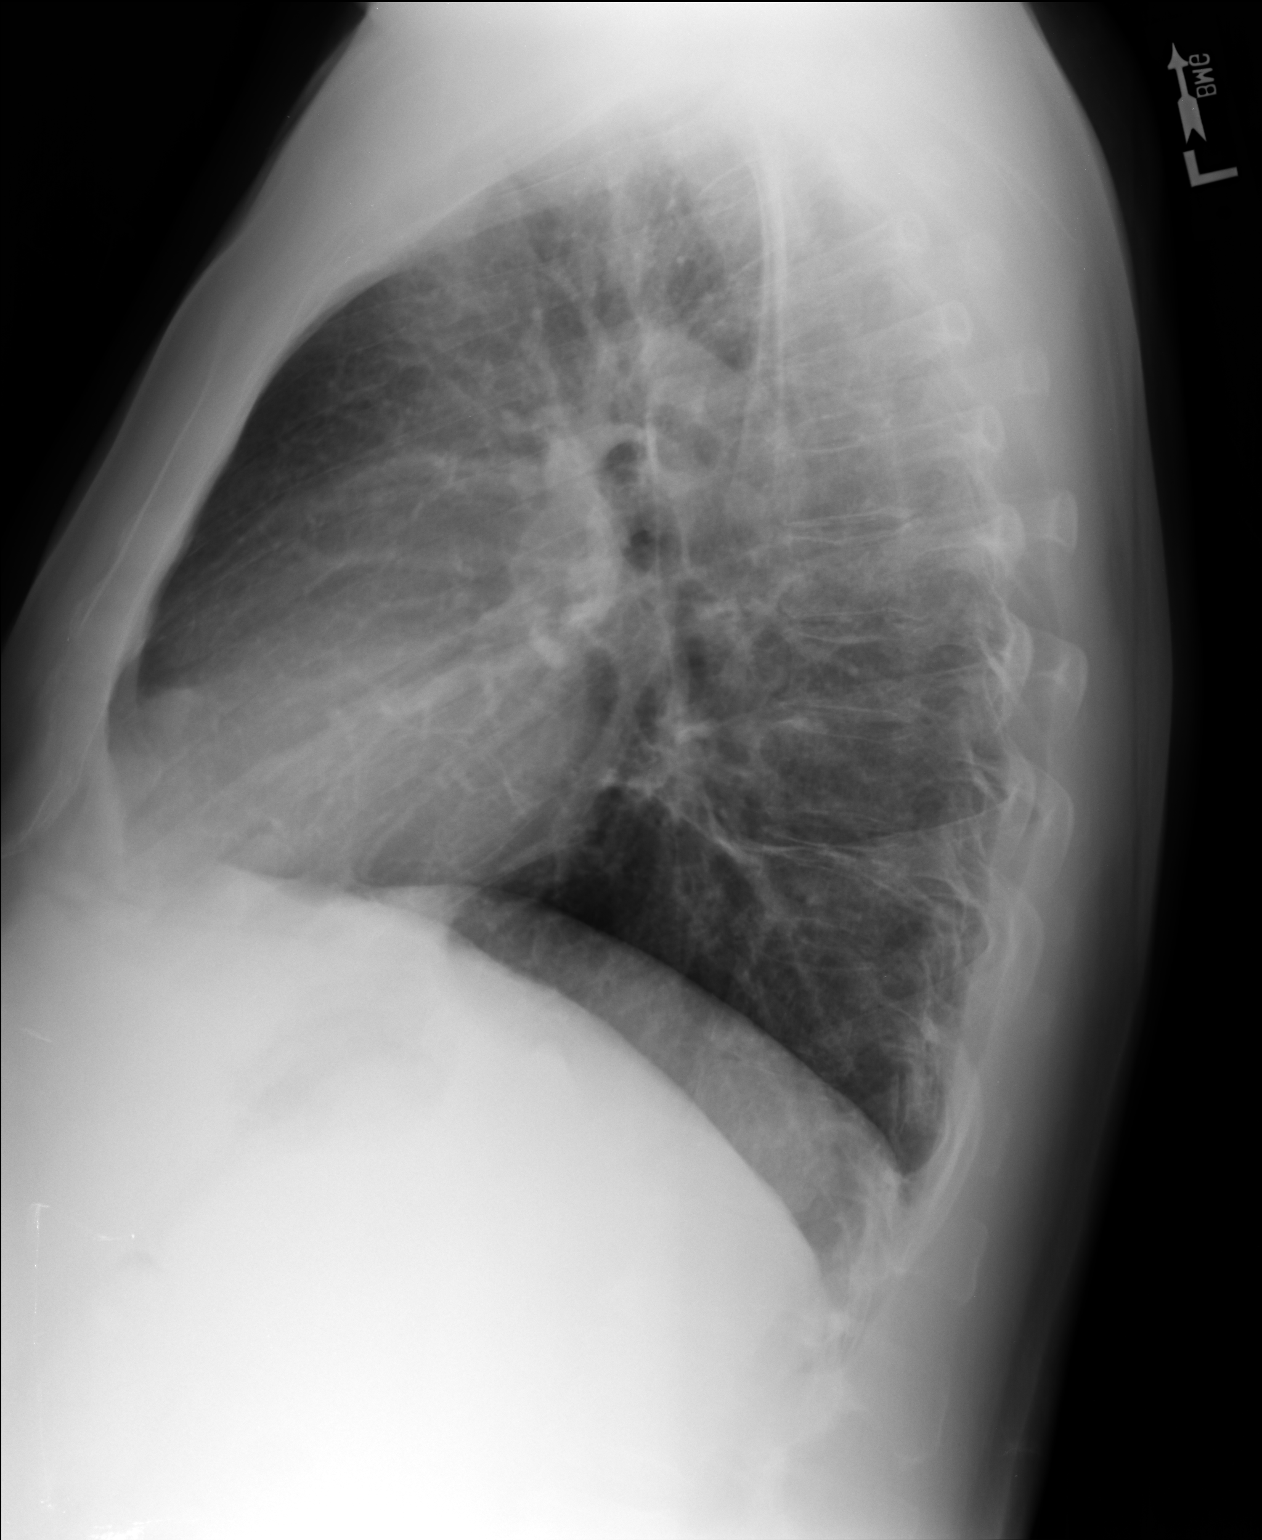

[2 of 2 positions shown; findings below may reference images not displayed]

FINDINGS: Heart size is normal. Thin curvilinear left lower lobe scarring is
noted. Aeration is improved since previously with interval apparent
extubation and removal of right IJ line. No pleural effusion. No new
focal pulmonary opacity.
IMPRESSION: Left lower lobe scarring.  No acute abnormality.

## 2014-06-17 ENCOUNTER — Other Ambulatory Visit: Payer: Self-pay | Admitting: Family Medicine

## 2014-06-17 DIAGNOSIS — R748 Abnormal levels of other serum enzymes: Secondary | ICD-10-CM

## 2014-06-18 ENCOUNTER — Telehealth: Payer: Self-pay

## 2014-06-18 NOTE — Telephone Encounter (Signed)
Pt states Dr. Marin Comment put in orders for him to have blood work done on this week, but it won't be until next week when he gets back in town but will be in at that time, wanted to make sure the orders will still be good Please call pt at [phone] if needed

## 2014-06-18 NOTE — Telephone Encounter (Signed)
Pt has future lab orders that do not expire until 2016- LM to advise pt lab orders will still be good

## 2014-07-02 ENCOUNTER — Telehealth: Payer: Self-pay | Admitting: Family Medicine

## 2014-07-02 NOTE — Telephone Encounter (Signed)
LM to ask him to come in and get CMP rechecked due to abnormalities from last time. Precautions given.

## 2014-07-03 ENCOUNTER — Telehealth: Payer: Self-pay
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
## 2014-07-03 NOTE — Telephone Encounter (Signed)
Patient Result Comments     Entered by Glenford Bayley, DO at 06/17/2014 2:33 PM    Your labs look stable to me, the liver enzymes are on the upper limits of high from where you have been before. However the bilirubin was high. I would like to get them rechecked. If you are having itchiness or yellowing of the skin then we need to get that looked at. Can you do that for me today or tomorrow. I have put in the orders.     He needs to come in soon, this weekend is okay, if this is all he has available, would not wait much longer than this. I have called him, left message for him to come in as soon as possible for these labs, to you FYI

## 2014-07-05 ENCOUNTER — Other Ambulatory Visit: Payer: Self-pay | Admitting: Physician Assistant

## 2014-07-05 ENCOUNTER — Other Ambulatory Visit (INDEPENDENT_AMBULATORY_CARE_PROVIDER_SITE_OTHER): Payer: 59

## 2014-07-05 DIAGNOSIS — R748 Abnormal levels of other serum enzymes: Secondary | ICD-10-CM

## 2014-07-05 LAB — COMPLETE METABOLIC PANEL WITH GFR
ALT: 35 U/L (ref 0–53)
AST: 71 U/L — ABNORMAL HIGH (ref 0–37)
Albumin: 3.4 g/dL — ABNORMAL LOW (ref 3.5–5.2)
Alkaline Phosphatase: 200 U/L — ABNORMAL HIGH (ref 39–117)
Potassium: 4.5 mEq/L (ref 3.5–5.3)
Sodium: 135 mEq/L (ref 135–145)
Total Protein: 6.5 g/dL (ref 6.0–8.3)

## 2014-07-05 LAB — COMPLETE METABOLIC PANEL WITHOUT GFR
BUN: 9 mg/dL (ref 6–23)
CO2: 27 meq/L (ref 19–32)
Calcium: 8.8 mg/dL (ref 8.4–10.5)
Chloride: 103 meq/L (ref 96–112)
Creat: 0.7 mg/dL (ref 0.50–1.35)
GFR, Est African American: >89 mL/min
GFR, Est Non African American: >89 mL/min
Glucose, Bld: 115 mg/dL — ABNORMAL HIGH (ref 70–99)
Total Bilirubin: 4.4 mg/dL — ABNORMAL HIGH (ref 0.2–1.2)

## 2014-07-05 MED ORDER — MUPIROCIN 2 % EX OINT: 1.0000 "application " | TOPICAL_OINTMENT | Freq: Two times a day (BID) | CUTANEOUS | Status: AC

## 2014-07-07 ENCOUNTER — Telehealth: Payer: Self-pay | Admitting: Family Medicine

## 2014-07-07 NOTE — Telephone Encounter (Signed)
LM to call me back about his labs. Needs to call me back.

## 2014-07-15 NOTE — Progress Notes (Signed)
History and physical examinations reviewed with Georgiann Mccoy, PA-C. CXR reviewed. Agree with assessment and plan.

## 2014-07-20 ENCOUNTER — Telehealth: Payer: Self-pay

## 2014-07-20 DIAGNOSIS — R748 Abnormal levels of other serum enzymes: Secondary | ICD-10-CM

## 2014-07-20 NOTE — Telephone Encounter (Signed)
Patient spoke to Dr Marin Comment regarding his labs done on 07/05/14. Per patient Dr Marin Comment requested he come back in to the walk in clinic in 2 weeks to have labs redone. Patient is wanting to come on Monday November 9th but there are no orders in. There is a note in the lab results from 07/05/14 Dr. Marin Comment wanted him to repeat the labs. Patients call back number is [phone]

## 2014-07-21 NOTE — Telephone Encounter (Signed)
Can we do a lab only?

## 2014-07-22 NOTE — Telephone Encounter (Signed)
Please call him and let him know lab was ordered, he can come in for labs only. I thought I had done that but guess not. Thanks Dr Marin Comment

## 2014-07-22 NOTE — Telephone Encounter (Signed)
Pt advised.

## 2014-07-23 ENCOUNTER — Other Ambulatory Visit (INDEPENDENT_AMBULATORY_CARE_PROVIDER_SITE_OTHER): Payer: 59 | Admitting: *Deleted

## 2014-07-23 DIAGNOSIS — R748 Abnormal levels of other serum enzymes: Secondary | ICD-10-CM

## 2014-07-23 LAB — COMPLETE METABOLIC PANEL WITHOUT GFR
ALT: 41 U/L (ref 0–53)
AST: 73 U/L — ABNORMAL HIGH (ref 0–37)
Alkaline Phosphatase: 218 U/L — ABNORMAL HIGH (ref 39–117)
CO2: 28 meq/L (ref 19–32)
GFR, Est African American: >89 mL/min
Sodium: 138 meq/L (ref 135–145)
Total Bilirubin: 4.7 mg/dL — ABNORMAL HIGH (ref 0.2–1.2)
Total Protein: 7 g/dL (ref 6.0–8.3)

## 2014-07-23 LAB — COMPLETE METABOLIC PANEL WITH GFR
Albumin: 3.7 g/dL (ref 3.5–5.2)
BUN: 9 mg/dL (ref 6–23)
Calcium: 9.3 mg/dL (ref 8.4–10.5)
Chloride: 103 mEq/L (ref 96–112)
Creat: 0.67 mg/dL (ref 0.50–1.35)
GFR, Est Non African American: >89 mL/min
Glucose, Bld: 113 mg/dL — ABNORMAL HIGH (ref 70–99)
Potassium: 4.7 mEq/L (ref 3.5–5.3)

## 2014-07-28 ENCOUNTER — Telehealth: Payer: Self-pay

## 2014-07-28 NOTE — Telephone Encounter (Signed)
Patient was supposed to come in for labs-see multiple messages from Dr. Marin Comment. What's the plan?  Dr. Marin Comment is checking on the propranolol alternatives.

## 2014-07-28 NOTE — Telephone Encounter (Signed)
Patient wants to know if there is a cheaper version of the propanalol that can be called in to [facility] on [address]

## 2014-07-28 NOTE — Telephone Encounter (Signed)
Please advise 

## 2014-07-29 NOTE — Telephone Encounter (Signed)
Labs were already completed- 11/11

## 2014-07-30 ENCOUNTER — Other Ambulatory Visit: Payer: Self-pay | Admitting: Family Medicine

## 2014-07-30 ENCOUNTER — Telehealth: Payer: Self-pay | Admitting: Family Medicine

## 2014-07-30 DIAGNOSIS — R748 Abnormal levels of other serum enzymes: Secondary | ICD-10-CM

## 2014-07-30 DIAGNOSIS — R7989 Other specified abnormal findings of blood chemistry: Secondary | ICD-10-CM

## 2014-07-30 NOTE — Telephone Encounter (Signed)
Advised patient that he can o online to www.goodrx.com and print our discount coupon and present to the pharmacy and he should get 1/2 off or about roughly that. [facility] is cheaper but [facility] where he goes charges $62 for a 30 day supply and with the discount card it is about $32. This is the best we can do.

## 2014-07-30 NOTE — Telephone Encounter (Signed)
Spoke with Dr Watt Climes then spoke with patient in regards to his labs, since pt has no insurance Dr Watt Climes recommends that he get an abd or liver ultrasound. Recommend further evaluation and treatment once we get results.  He needs f/u with GI anyhow for varices and banding, needs colonscopy once he gets insurance. Will ask medical records to Fax last 4 sets of lab records ( CBC, CMP)  to [phone] Attn Dr Watt Climes.

## 2014-07-30 NOTE — Telephone Encounter (Signed)
Patient states that he has a question about his labs. Patient received a call earlier from Dr. Marin Comment.   [phone]

## 2014-08-01 ENCOUNTER — Other Ambulatory Visit: Payer: Self-pay

## 2014-08-01 MED ORDER — PROPRANOLOL HCL ER 60 MG PO CP24: ORAL_CAPSULE | ORAL | Status: DC

## 2014-08-01 NOTE — Telephone Encounter (Signed)
Labs faxed to Dr. Watt Climes, as requested by Dr. Marin Comment.

## 2014-08-12 ENCOUNTER — Other Ambulatory Visit: Payer: 59

## 2014-08-25 ENCOUNTER — Telehealth: Payer: Self-pay

## 2014-08-25 NOTE — Telephone Encounter (Signed)
FYI Pt cancelled [place] due to no insurance.

## 2014-08-25 NOTE — Telephone Encounter (Signed)
Pamala Hurry @ Dr Perley Jain office calling to let us know she noticed in epic patient never went to have his ultra sound done of his abdomen. I did see the appointment he had at [facility] imaging he canceled it. Then notes in that appointment states patient to call back to reschedule. Pamala Hurry call back number is [phone]

## 2014-09-08 ENCOUNTER — Other Ambulatory Visit: Payer: Self-pay | Admitting: Family Medicine

## 2014-09-19 ENCOUNTER — Ambulatory Visit (INDEPENDENT_AMBULATORY_CARE_PROVIDER_SITE_OTHER): Payer: Managed Care, Other (non HMO) | Admitting: Family Medicine

## 2014-09-19 ENCOUNTER — Encounter: Payer: Self-pay | Admitting: Family Medicine

## 2014-09-19 VITALS — BP 121/77 | HR 64 | Temp 97.6°F | Resp 16 | Ht 69.0 in | Wt 192.2 lb

## 2014-09-19 DIAGNOSIS — N529 Male erectile dysfunction, unspecified: Secondary | ICD-10-CM

## 2014-09-19 DIAGNOSIS — K922 Gastrointestinal hemorrhage, unspecified: Secondary | ICD-10-CM

## 2014-09-19 DIAGNOSIS — R6882 Decreased libido: Secondary | ICD-10-CM

## 2014-09-19 DIAGNOSIS — Z1329 Encounter for screening for other suspected endocrine disorder: Secondary | ICD-10-CM

## 2014-09-19 DIAGNOSIS — G4700 Insomnia, unspecified: Secondary | ICD-10-CM

## 2014-09-19 DIAGNOSIS — F411 Generalized anxiety disorder: Secondary | ICD-10-CM

## 2014-09-19 DIAGNOSIS — K7030 Alcoholic cirrhosis of liver without ascites: Secondary | ICD-10-CM

## 2014-09-19 LAB — LIPID PANEL
Cholesterol: 159 mg/dL (ref 0–200)
HDL: 23 mg/dL — ABNORMAL LOW (ref >39)
LDL Cholesterol: 96 mg/dL (ref 0–99)
Total CHOL/HDL Ratio: 6.9 Ratio
Triglycerides: 200 mg/dL — ABNORMAL HIGH (ref <150)
VLDL: 40 mg/dL (ref 0–40)

## 2014-09-19 LAB — COMPLETE METABOLIC PANEL WITHOUT GFR
ALT: 36 U/L (ref 0–53)
AST: 76 U/L — ABNORMAL HIGH (ref 0–37)
Creat: 0.66 mg/dL (ref 0.50–1.35)
Potassium: 4.3 meq/L (ref 3.5–5.3)
Total Protein: 6.9 g/dL (ref 6.0–8.3)

## 2014-09-19 LAB — COMPLETE METABOLIC PANEL WITH GFR
Albumin: 3.6 g/dL (ref 3.5–5.2)
Alkaline Phosphatase: 250 U/L — ABNORMAL HIGH (ref 39–117)
BUN: 8 mg/dL (ref 6–23)
CO2: 27 mEq/L (ref 19–32)
Calcium: 8.9 mg/dL (ref 8.4–10.5)
Chloride: 102 mEq/L (ref 96–112)
GFR, Est African American: >89 mL/min
GFR, Est Non African American: >89 mL/min
Glucose, Bld: 95 mg/dL (ref 70–99)
Sodium: 139 mEq/L (ref 135–145)
Total Bilirubin: 7.4 mg/dL — ABNORMAL HIGH (ref 0.2–1.2)

## 2014-09-19 MED ORDER — TRAZODONE HCL 50 MG PO TABS: 25.0000 mg | ORAL_TABLET | Freq: Every day | ORAL | Status: AC

## 2014-09-19 MED ORDER — ALPRAZOLAM 0.5 MG PO TABS: 0.5000 mg | ORAL_TABLET | Freq: Four times a day (QID) | ORAL | Status: DC | PRN

## 2014-09-19 NOTE — Progress Notes (Signed)
Subjective:    Patient ID: Jake [name], male    DOB: 1955/12/26, 59 y.o.   MRN: [id number]  PCP: Leotis Pain, DO  Chief Complaint  Patient presents with  . Medication Refill  . Wants to change GI doctor   Patient Active Problem List   Diagnosis Date Noted  . Acute upper GI bleed 09/13/2013  . UGI bleed 09/13/2013  . Acute blood loss anemia 11/29/2012  . Liver cirrhosis, alcoholic 60/73/7106  . Pancytopenia 11/29/2012  . Acute GI bleeding 11/28/2012  . Generalized anxiety disorder 11/28/2012  . Insomnia 11/28/2012   Prior to Admission medications   Medication Sig Start Date End Date Taking? Authorizing Provider  ALPRAZolam Duanne Moron) 0.5 MG tablet Take 1 tablet (0.5 mg total) by mouth 4 (four) times daily as needed for anxiety or sleep. 09/19/14  Yes Thao P Le, DO  ferrous sulfate 325 (65 FE) MG tablet Take 1 tablet (325 mg total) by mouth 2 (two) times daily. 04/13/13  Yes Thao P Le, DO  fluorouracil (EFUDEX) 5 % cream Apply 1 application topically daily as needed. For pre cancer spots   Yes Historical Provider, MD  folic acid (FOLVITE) 1 MG tablet Take 1 tablet (1 mg total) by mouth daily. PATIENT NEEDS OFFICE VISIT/LABS FOR ADDITIONAL REFILLS 09/09/14  Yes Thao P Le, DO  Multiple Vitamin (MULTIVITAMIN WITH MINERALS) TABS Take 1 tablet by mouth daily.   Yes Historical Provider, MD  mupirocin ointment (BACTROBAN) 2 % Apply 1 application topically 2 (two) times daily. 07/05/14  Yes Mancel Bale, PA-C  pantoprazole (PROTONIX) 40 MG tablet Take 1 tablet by mouth two  times daily 06/09/14  Yes Thao P Le, DO  propranolol ER (INDERAL LA) 60 MG 24 hr capsule Take 1 capsule by mouth at  bedtime 08/01/14  Yes Thao P Le, DO  thiamine 100 MG tablet Take 1 tablet (100 mg total) by mouth daily. 04/13/13  Yes Thao P Le, DO  traZODone (DESYREL) 50 MG tablet Take 0.5 tablets (25 mg total) by mouth at bedtime. 09/19/14   Thao P Le, DO   Medications, allergies, past medical history, surgical history,  family history, social history and problem list reviewed and updated.  HPI  51 yom with pmh cirrhosis, esophageal varicies, and insomnia presents for f/u.   He states he continues to struggle with sleep. He has no trouble falling asleep but wakes up after 1-2 hrs each night. He takes his xanax tid. He feels the xanax does work to help relax him and help him get to sleep. He is still taking 25 mg trazodone nightly for sleep. He thinks this helps a little but it gives him weird dreams. He has ambien at home that he has filled but hasn't started it yet as he is worried to be on too many sleep meds at night.   He typically [name] asleep around 8pm on the cough watching the news. He will sleep for about 2 hrs on the couch then wake. This is when he has trouble falling back asleep. He also will wake up with his wife at 4am when she goes to work then have trouble falling back asleep. He takes a 1-2 hour nap every day early afternoon.   He is taking his ferrous once daily, knows he should be taking it twice daily.    He states today that he is not happy with his GI physician and office.He states that they call him too much as he owes  money. He is thinking about changing GI physicians but is anxious about this.   Hx alcohol abuse with current cirrhosis and varicies. He is currently drinking approx 4-5 beers a day, 2 days per week. He doesn't do this every week however. He hasn't had alcohol in about 3 wks at this point.   A LUQ [place] was ordered on him several months ago with the cirrhosis and liver enzymes that continue to be elevated, but he has not gotten this done yet due to expense.  Review of Systems No CP, SOB, fever, chills.     Objective:   Physical Exam  Constitutional: He is oriented to person, place, and time. He appears well-developed and well-nourished.  Non-toxic appearance. He does not have a sickly appearance. He does not appear ill. No distress.  BP 121/77 mmHg  Pulse 64  Temp(Src)  97.6 F (36.4 C) (Oral)  Resp 16  Ht 5\' 9"  (1.753 m)  Wt 192 lb 3.2 oz (87.181 kg)  BMI 28.37 kg/m2  SpO2 97%   Cardiovascular: Normal rate and regular rhythm.  Exam reveals no gallop.   No murmur heard. Pulmonary/Chest: Effort normal and breath sounds normal. He has no decreased breath sounds. He has no wheezes. He has no rhonchi. He has no rales.  Abdominal: Soft. Normal appearance and bowel sounds are normal. There is hepatomegaly. There is no tenderness. There is no tenderness at McBurney's point.  Liver edge palpable 1 cm past costal border.   Neurological: He is alert and oriented to person, place, and time.  No asterixis.   Skin:  No spider angiomas. No caput medusae. No palmar erythema.       Assessment & Plan:   86 yom with pmh cirrhosis, esophageal varicies, and insomnia presents for f/u.   Insomnia - Plan: COMPLETE METABOLIC PANEL WITH GFR, Lipid panel, CBC, TSH  Generalized anxiety disorder - Plan: COMPLETE METABOLIC PANEL WITH GFR, Lipid panel, CBC  Alcoholic cirrhosis of liver without ascites - Plan: COMPLETE METABOLIC PANEL WITH GFR, Lipid panel, CBC, US Abdomen Limited, Testosterone, free, total, Testosterone, free, total  Screening for thyroid disorder - Plan: TSH  Decreased libido - Plan: Testosterone, free, total, Testosterone, free, total, CANCELED: Testosterone,Free and Total  Erectile dysfunction, unspecified erectile dysfunction type - Plan: Testosterone, free, total, Testosterone, free, total, CANCELED: Testosterone,Free and Total  A&P per Dr Marin Comment.   Julieta Gutting, PA-C Physician Assistant-Certified [facility]  09/19/2014 2:54 PM  Agree with A/P-advise to stop dingking period. He needs [place] and he will get this. He is hesitant to do any further testing  But he needs to get this done and will refer him back to GI. Dr Marin Comment 09/19/13  09/22/2014 @ 4:13---Called patient regarding labs, He is actually in ER for rectal  bleeding.

## 2014-09-20 LAB — CBC
HCT: 46.6 % (ref 39.0–52.0)
Hemoglobin: 15.8 g/dL (ref 13.0–17.0)
MCH: 32.9 pg (ref 26.0–34.0)
MCHC: 33.9 g/dL (ref 30.0–36.0)
MCV: 97.1 fL (ref 78.0–100.0)
MPV: 11.6 fL (ref 8.6–12.4)
Platelets: 64 10*3/uL — ABNORMAL LOW (ref 150–400)
RBC: 4.8 MIL/uL (ref 4.22–5.81)
RDW: 17.3 % — ABNORMAL HIGH (ref 11.5–15.5)
WBC: 2.6 10*3/uL — ABNORMAL LOW (ref 4.0–10.5)

## 2014-09-20 LAB — TSH: TSH: 3.298 u[IU]/mL (ref 0.350–4.500)

## 2014-09-22 ENCOUNTER — Inpatient Hospital Stay (HOSPITAL_COMMUNITY)
Admission: EM | Admit: 2014-09-22 | Discharge: 2014-09-23 | DRG: 378 | Disposition: A | Discharge status: Home / Self Care | Payer: Managed Care, Other (non HMO) | Attending: Family Medicine | Admitting: Family Medicine

## 2014-09-22 ENCOUNTER — Encounter (HOSPITAL_COMMUNITY): Payer: Self-pay | Admitting: Emergency Medicine

## 2014-09-22 DIAGNOSIS — F411 Generalized anxiety disorder: Secondary | ICD-10-CM | POA: Diagnosis present

## 2014-09-22 DIAGNOSIS — K922 Gastrointestinal hemorrhage, unspecified: Secondary | ICD-10-CM | POA: Diagnosis present

## 2014-09-22 DIAGNOSIS — I8501 Esophageal varices with bleeding: Secondary | ICD-10-CM | POA: Diagnosis present

## 2014-09-22 DIAGNOSIS — D61818 Other pancytopenia: Secondary | ICD-10-CM | POA: Diagnosis present

## 2014-09-22 DIAGNOSIS — D62 Acute posthemorrhagic anemia: Secondary | ICD-10-CM | POA: Diagnosis present

## 2014-09-22 DIAGNOSIS — K7030 Alcoholic cirrhosis of liver without ascites: Secondary | ICD-10-CM | POA: Diagnosis present

## 2014-09-22 DIAGNOSIS — Z87891 Personal history of nicotine dependence: Secondary | ICD-10-CM

## 2014-09-22 DIAGNOSIS — E875 Hyperkalemia: Secondary | ICD-10-CM | POA: Diagnosis present

## 2014-09-22 DIAGNOSIS — F419 Anxiety disorder, unspecified: Secondary | ICD-10-CM | POA: Diagnosis present

## 2014-09-22 DIAGNOSIS — F10988 Alcohol use, unspecified with other alcohol-induced disorder: Secondary | ICD-10-CM | POA: Diagnosis present

## 2014-09-22 DIAGNOSIS — E291 Testicular hypofunction: Secondary | ICD-10-CM

## 2014-09-22 DIAGNOSIS — G4700 Insomnia, unspecified: Secondary | ICD-10-CM | POA: Diagnosis present

## 2014-09-22 DIAGNOSIS — K2970 Gastritis, unspecified, without bleeding: Secondary | ICD-10-CM

## 2014-09-22 DIAGNOSIS — E349 Endocrine disorder, unspecified: Secondary | ICD-10-CM | POA: Diagnosis present

## 2014-09-22 LAB — COMPREHENSIVE METABOLIC PANEL
ALBUMIN: 3.8 g/dL (ref 3.5–5.2)
ALK PHOS: 273 U/L — AB (ref 39–117)
ALT: 48 U/L (ref 0–53)
AST: 114 U/L — AB (ref 0–37)
Anion gap: 3 — ABNORMAL LOW (ref 5–15)
BUN: 6 mg/dL (ref 6–23)
CO2: 26 mmol/L (ref 19–32)
CREATININE: 0.51 mg/dL (ref 0.50–1.35)
Calcium: 8.5 mg/dL (ref 8.4–10.5)
Chloride: 106 mEq/L (ref 96–112)
GFR calc Af Amer: >90 mL/min (ref >90)
GFR calc non Af Amer: >90 mL/min (ref >90)
GLUCOSE: 155 mg/dL — AB (ref 70–99)
Potassium: 5.2 mmol/L — ABNORMAL HIGH (ref 3.5–5.1)
Sodium: 135 mmol/L (ref 135–145)
Total Bilirubin: 5 mg/dL — ABNORMAL HIGH (ref 0.3–1.2)
Total Protein: 7.5 g/dL (ref 6.0–8.3)

## 2014-09-22 LAB — TESTOSTERONE, FREE, TOTAL, SHBG
Sex Hormone Binding: 89 nmol/L — ABNORMAL HIGH (ref 22–77)
Testosterone, Free: 78.9 pg/mL (ref 47.0–244.0)
Testosterone-% Free: 1.1 % — ABNORMAL LOW (ref 1.6–2.9)
Testosterone: 737 ng/dL (ref 300–890)

## 2014-09-22 LAB — CBC WITH DIFFERENTIAL/PLATELET
Basophils Absolute: 0 10*3/uL (ref 0.0–0.1)
Basophils Relative: 0 % (ref 0–1)
EOS ABS: 0 10*3/uL (ref 0.0–0.7)
EOS PCT: 1 % (ref 0–5)
HCT: 42.8 % (ref 39.0–52.0)
Hemoglobin: 14.9 g/dL (ref 13.0–17.0)
LYMPHS ABS: 0.5 10*3/uL — AB (ref 0.7–4.0)
LYMPHS PCT: 17 % (ref 12–46)
MCH: 33.6 pg (ref 26.0–34.0)
MCHC: 34.8 g/dL (ref 30.0–36.0)
MCV: 96.6 fL (ref 78.0–100.0)
Monocytes Absolute: 0.6 10*3/uL (ref 0.1–1.0)
Monocytes Relative: 17 % — ABNORMAL HIGH (ref 3–12)
Neutro Abs: 2.1 10*3/uL (ref 1.7–7.7)
Neutrophils Relative %: 65 % (ref 43–77)
Platelets: 49 10*3/uL — ABNORMAL LOW (ref 150–400)
RBC: 4.43 MIL/uL (ref 4.22–5.81)
RDW: 16.2 % — AB (ref 11.5–15.5)
WBC: 3.2 10*3/uL — ABNORMAL LOW (ref 4.0–10.5)

## 2014-09-22 LAB — CBC
HEMATOCRIT: 46.9 % (ref 39.0–52.0)
Hemoglobin: 16.5 g/dL (ref 13.0–17.0)
MCH: 34.1 pg — AB (ref 26.0–34.0)
MCHC: 35.2 g/dL (ref 30.0–36.0)
MCV: 96.9 fL (ref 78.0–100.0)
Platelets: 65 10*3/uL — ABNORMAL LOW (ref 150–400)
RBC: 4.84 MIL/uL (ref 4.22–5.81)
RDW: 16.3 % — AB (ref 11.5–15.5)
WBC: 3.5 10*3/uL — AB (ref 4.0–10.5)

## 2014-09-22 LAB — TYPE AND SCREEN
ABO/RH(D): A NEG
ANTIBODY SCREEN: NEGATIVE

## 2014-09-22 LAB — LIPASE, BLOOD: LIPASE: 54 U/L (ref 11–59)

## 2014-09-22 LAB — POC OCCULT BLOOD, ED: FECAL OCCULT BLD: POSITIVE — AB

## 2014-09-22 MED ORDER — CHLORDIAZEPOXIDE HCL 25 MG PO CAPS: 25.0000 mg | ORAL_CAPSULE | Freq: Three times a day (TID) | ORAL | Status: DC

## 2014-09-22 MED ORDER — ADULT MULTIVITAMIN W/MINERALS CH
1.0000 | ORAL_TABLET | Freq: Every day | ORAL | Status: DC
Start: 2014-09-22 — End: 2014-09-23
  Administered 2014-09-23: 1 via ORAL
  Filled 2014-09-22: qty 1

## 2014-09-22 MED ORDER — PANTOPRAZOLE SODIUM 40 MG PO TBEC
40.0000 mg | DELAYED_RELEASE_TABLET | Freq: Two times a day (BID) | ORAL | Status: DC
  Administered 2014-09-22 – 2014-09-23 (×2): 40 mg via ORAL
  Filled 2014-09-22 (×3): qty 1

## 2014-09-22 MED ORDER — THIAMINE HCL 100 MG/ML IJ SOLN
Freq: Once | INTRAVENOUS | Status: AC
  Administered 2014-09-22: 18:00:00 via INTRAVENOUS
  Filled 2014-09-22: qty 1000

## 2014-09-22 MED ORDER — CHLORDIAZEPOXIDE HCL 25 MG PO CAPS: 25.0000 mg | ORAL_CAPSULE | ORAL | Status: DC

## 2014-09-22 MED ORDER — CHLORDIAZEPOXIDE HCL 25 MG PO CAPS: 25.0000 mg | ORAL_CAPSULE | Freq: Four times a day (QID) | ORAL | Status: DC | PRN

## 2014-09-22 MED ORDER — CHLORDIAZEPOXIDE HCL 25 MG PO CAPS: 25.0000 mg | ORAL_CAPSULE | Freq: Every day | ORAL | Status: DC

## 2014-09-22 MED ORDER — CHLORDIAZEPOXIDE HCL 25 MG PO CAPS
25.0000 mg | ORAL_CAPSULE | Freq: Four times a day (QID) | ORAL | Status: DC
  Administered 2014-09-22 – 2014-09-23 (×2): 25 mg via ORAL
  Filled 2014-09-22 (×2): qty 1

## 2014-09-22 MED ORDER — VITAMIN B-1 100 MG PO TABS: 100.0000 mg | ORAL_TABLET | Freq: Every day | ORAL | Status: DC

## 2014-09-22 MED ORDER — VITAMIN B-1 100 MG PO TABS
100.0000 mg | ORAL_TABLET | Freq: Every day | ORAL | Status: DC
  Administered 2014-09-22 – 2014-09-23 (×2): 100 mg via ORAL
  Filled 2014-09-22 (×2): qty 1

## 2014-09-22 MED ORDER — PANTOPRAZOLE SODIUM 40 MG IV SOLR
40.0000 mg | Freq: Once | INTRAVENOUS | Status: AC
  Administered 2014-09-22: 40 mg via INTRAVENOUS
  Filled 2014-09-22: qty 40

## 2014-09-22 MED ORDER — PROMETHAZINE HCL 25 MG PO TABS
12.5000 mg | ORAL_TABLET | Freq: Four times a day (QID) | ORAL | Status: DC | PRN
Start: 2014-09-22 — End: 2014-09-23

## 2014-09-22 MED ORDER — FERROUS SULFATE 325 (65 FE) MG PO TABS
325.0000 mg | ORAL_TABLET | Freq: Every day | ORAL | Status: DC
  Administered 2014-09-23: 325 mg via ORAL
  Filled 2014-09-22 (×2): qty 1

## 2014-09-22 MED ORDER — SODIUM CHLORIDE 0.9 % IV SOLN: INTRAVENOUS | Status: DC

## 2014-09-22 MED ORDER — HYDROXYZINE HCL 25 MG PO TABS
25.0000 mg | ORAL_TABLET | Freq: Four times a day (QID) | ORAL | Status: DC | PRN
  Filled 2014-09-22: qty 1

## 2014-09-22 MED ORDER — ALPRAZOLAM 0.5 MG PO TABS
0.5000 mg | ORAL_TABLET | Freq: Four times a day (QID) | ORAL | Status: DC | PRN
  Administered 2014-09-22 – 2014-09-23 (×3): 0.5 mg via ORAL
  Filled 2014-09-22 (×4): qty 1

## 2014-09-22 MED ORDER — LOPERAMIDE HCL 2 MG PO CAPS: 2.0000 mg | ORAL_CAPSULE | ORAL | Status: DC | PRN

## 2014-09-22 MED ORDER — ADULT MULTIVITAMIN W/MINERALS CH: 1.0000 | ORAL_TABLET | Freq: Every day | ORAL | Status: DC

## 2014-09-22 MED ORDER — ONDANSETRON 4 MG PO TBDP
4.0000 mg | ORAL_TABLET | Freq: Four times a day (QID) | ORAL | Status: DC | PRN
  Filled 2014-09-22: qty 1

## 2014-09-22 MED ORDER — TRAZODONE 25 MG HALF TABLET
25.0000 mg | ORAL_TABLET | Freq: Every day | ORAL | Status: DC
  Administered 2014-09-22: 25 mg via ORAL
  Filled 2014-09-22 (×2): qty 1

## 2014-09-22 MED ORDER — THIAMINE HCL 100 MG/ML IJ SOLN: 100.0000 mg | Freq: Once | INTRAMUSCULAR | Status: DC

## 2014-09-22 MED ORDER — PROPRANOLOL HCL ER 60 MG PO CP24
60.0000 mg | ORAL_CAPSULE | Freq: Every day | ORAL | Status: DC
  Administered 2014-09-22 – 2014-09-23 (×2): 60 mg via ORAL
  Filled 2014-09-22 (×2): qty 1

## 2014-09-22 MED ORDER — SODIUM CHLORIDE 0.9 % IV SOLN
INTRAVENOUS | Status: DC
  Administered 2014-09-23: 02:00:00 via INTRAVENOUS

## 2014-09-22 MED ORDER — LORAZEPAM 2 MG/ML IJ SOLN
0.5000 mg | Freq: Once | INTRAMUSCULAR | Status: AC
  Administered 2014-09-22: 0.5 mg via INTRAVENOUS
  Filled 2014-09-22: qty 1

## 2014-09-22 MED ORDER — ONDANSETRON HCL 4 MG/2ML IJ SOLN
4.0000 mg | Freq: Once | INTRAMUSCULAR | Status: AC
Start: 2014-09-22 — End: 2014-09-22
  Administered 2014-09-22: 4 mg via INTRAVENOUS
  Filled 2014-09-22: qty 2

## 2014-09-22 NOTE — H&P (Signed)
[facility] History and Physical  Jake [name] HMC:[id number] DOB: [birth year] DOA: 09/22/2014  Referring physician: ED PCP: Leotis Pain, DO  Specialists: none  Chief Complaint: Rectal bleed  HPI:   59 y/o ? chr ETOH, Cirrhosis c Varices s/p scope c VaricealBands x 4 12/2013 Dr. 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  Awoke this am, had semi-soft stool and while putting on underwear had drops of brb on the underwear.  States no V blood, but some N this am.  Ate a breakfast of egg, biscuit and 1/2 banana and is hungry.   No cp,+ mild epigastric discomfort, no f,chills,diarr, fall, unilateral weakness, , denies seizure  Sodium 135 potassium 5.2 anion gap 3 alkaline phosphatase 273 AST 114/ALT 48 [baseline alkaline phosphatase 250, baseline AST 114 baseline total bili 7.4] Total bili 5.0 Platelets 65, WBC 3.5, hemoglobin 16.5 Sex hormone binding 89, testosterone 737, free testosterone 1.1% [ ?] Recent TSH 3.29   Review of Systems: The patient denies other findings on 12 system review as above  Past Medical History  Diagnosis Date  . Blood transfusion Sep 13, 2013  . Hypertension   . Anemia   . Anxiety   . Gout     hx of no recent flare ups  . Esophageal varices 11/29/12    Historical  . Cirrhosis 11/29/12    historical  . GERD (gastroesophageal reflux disease)   . Upper respiratory infection     finished antibiotic 11-18-2013, coughing up clear sputum, no temperature   Past Surgical History  Procedure Laterality Date  . Colonoscopy      2011-[facility]. Normal results per patient  . Upper gi endoscopy    . Esophagogastroduodenoscopy N/A 11/29/2012    Procedure: ESOPHAGOGASTRODUODENOSCOPY (EGD);  Surgeon: Cleotis Nipper, MD;  Location: [facility];   Service: Endoscopy;  Laterality: N/A;  . Esophagogastroduodenoscopy N/A 01/16/2013    Procedure: ESOPHAGOGASTRODUODENOSCOPY (EGD);  Surgeon: Jeryl Columbia, MD;  Location: [facility];  Service: Endoscopy;  Laterality: N/A;   ? bedside case  . Esophageal banding N/A 01/16/2013    Procedure: ESOPHAGEAL BANDING;  Surgeon: Jeryl Columbia, MD;  Location: [facility];  Service: Endoscopy;  Laterality: N/A;  . Esophagogastroduodenoscopy N/A 02/07/2013    Procedure: ESOPHAGOGASTRODUODENOSCOPY (EGD);  Surgeon: Jeryl Columbia, MD;  Location: [facility];  Service: Endoscopy;  Laterality: N/A;  . Esophageal banding N/A 02/07/2013    Procedure: ESOPHAGEAL BANDING;  Surgeon: Jeryl Columbia, MD;  Location: [facility];  Service: Endoscopy;  Laterality: N/A;  . Esophagogastroduodenoscopy N/A 09/13/2013    Procedure: ESOPHAGOGASTRODUODENOSCOPY (EGD);  Surgeon: Missy Sabins, MD;  Location: [facility];  Service: Endoscopy;  Laterality: N/A;  . Esophagogastroduodenoscopy (egd) with propofol N/A 10/10/2013    Procedure: ESOPHAGOGASTRODUODENOSCOPY (EGD) WITH PROPOFOL;  Surgeon: Jeryl Columbia, MD;  Location: [facility];  Service: Endoscopy;  Laterality: N/A;  . Esophageal banding N/A 10/10/2013    Procedure: ESOPHAGEAL BANDING;  Surgeon: Jeryl Columbia, MD;  Location: [facility];  Service: Endoscopy;  Laterality: N/A;  . Esophagogastroduodenoscopy (egd) with propofol N/A 12/11/2013    Procedure: ESOPHAGOGASTRODUODENOSCOPY (EGD) WITH PROPOFOL;  Surgeon: Jeryl Columbia, MD;  Location: [facility];  Service: Endoscopy;  Laterality: N/A;  . Esophageal banding N/A 12/11/2013    Procedure: ESOPHAGEAL  BANDING;  Surgeon: Jeryl Columbia, MD;  Location: [facility];  Service: Endoscopy;  Laterality: N/A;  . Colonoscopy with propofol N/A 12/11/2013    Procedure: COLONOSCOPY WITH PROPOFOL;  Surgeon: Jeryl Columbia, MD;  Location: [facility];  Service: Endoscopy;  Laterality: N/A;   Social History:  History   Social History Narrative   Went  to 3 year college for marketing   Did not graduate   2 children in their 73s from first marriage currently married for 16 years to second wife   Owns a Biomedical scientist business   Smoking until college-age 22 pack per day for ~10 years   Drinking since college on and off   No other illicit drug use       No Known Allergies  Family History  Problem Relation Age of Onset  . Hyperlipidemia Mother   . Subarachnoid hemorrhage Father     01/03/11 and deceased    Prior to Admission medications   Medication Sig Start Date End Date Taking? Authorizing Provider  ALPRAZolam Duanne Moron) 0.5 MG tablet Take 1 tablet (0.5 mg total) by mouth 4 (four) times daily as needed for anxiety or sleep. 09/19/14   Thao P Le, DO  ferrous sulfate 325 (65 FE) MG tablet Take 1 tablet (325 mg total) by mouth 2 (two) times daily. 04/13/13   Thao P Le, DO  fluorouracil (EFUDEX) 5 % cream Apply 1 application topically daily as needed. For pre cancer spots    Historical Provider, MD  folic acid (FOLVITE) 1 MG tablet Take 1 tablet (1 mg total) by mouth daily. PATIENT NEEDS OFFICE VISIT/LABS FOR ADDITIONAL REFILLS 09/09/14   Thao P Le, DO  Multiple Vitamin (MULTIVITAMIN WITH MINERALS) TABS Take 1 tablet by mouth daily.    Historical Provider, MD  mupirocin ointment (BACTROBAN) 2 % Apply 1 application topically 2 (two) times daily. 07/05/14   Mancel Bale, PA-C  pantoprazole (PROTONIX) 40 MG tablet Take 1 tablet by mouth two  times daily 06/09/14   Thao P Le, DO  propranolol ER (INDERAL LA) 60 MG 24 hr capsule Take 1 capsule by mouth at  bedtime 08/01/14   Thao P Le, DO  thiamine 100 MG tablet Take 1 tablet (100 mg total) by mouth daily. 04/13/13   Thao P Le, DO  traZODone (DESYREL) 50 MG tablet Take 0.5 tablets (25 mg total) by mouth at bedtime. 09/19/14   Thao P Le, DO   Physical Exam: Filed Vitals:   09/22/14 1312  BP: 125/94  Pulse: 81  Temp: 98.7 F (37.1 C)  TempSrc: Oral  Resp: 18  SpO2: 95%   Alert pleasant icteric no  pallor Mucosa dry No JVD No bruit S1-S2 normal sinus rhythm, no displacement of PMI Clinically clear chest no added sound Distended, hepatomegaly noted, tender in epigastrium, negative shifting dullness No lower extremity edema  Labs on Admission:  Basic Metabolic Panel:  Recent Labs Lab 09/19/14 1247 09/22/14 1314  NA 139 135  K 4.3 5.2*  CL 102 106  CO2 27 26  GLUCOSE 95 155*  BUN 8 6  CREATININE 0.66 0.51  CALCIUM 8.9 8.5   Liver Function Tests:  Recent Labs Lab 09/19/14 1247 09/22/14 1314  AST 76* 114*  ALT 36 48  ALKPHOS 250* 273*  BILITOT 7.4* 5.0*  PROT 6.9 7.5  ALBUMIN 3.6 3.8   No results for input(s): LIPASE, AMYLASE in the last 168 hours. No results for input(s): AMMONIA in the last 168 hours. CBC:  Recent Labs Lab 09/19/14 1247 09/22/14 1314  WBC 2.6* 3.5*  HGB 15.8 16.5  HCT 46.6 46.9  MCV 97.1 96.9  PLT 64* 65*   Cardiac Enzymes: No results for input(s): CKTOTAL, CKMB, CKMBINDEX, TROPONINI in the last 168 hours.  BNP (last 3 results) No results for input(s): PROBNP in the last 8760 hours. CBG: No results for input(s): GLUCAP in the last 168 hours.  Radiological Exams on Admission: No results found.  EKG: Independently reviewed. 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 If he goes home, I do think he represent high risk for bleeding if he continues ETOH. He might just have hemorrhoidal bleed as noted on 12/2013 Colonoscopy per note above, but I cannot predict this with  current data and clinical assesment   ETOH-drinks heavily q od Place on CIWA-Start on Librium protocol Insomnia-Needs to quit drinking Testosterone def 2/2 to cirrhosis [ ? 17-0H Aromatases]-Need to quit drinking-I did explain this in detial to him-Viagra probably would't help Pancytopenia 2/2 to etoh toxicity-Monitor counts q 8 Hyperkalemia-monitor in am-no cp.  Hold EKG for right now Prior Massive Variceal bleed-continue Propanolol 60 qd, cont Protnix 40 bid.   Much appreciate EDP assistance  Time spent: [place], [facility] Pager [phone]  If 7PM-7AM, please contact night-coverage [email or web address] Password [facility] 09/22/2014, 4:22 PM

## 2014-09-22 NOTE — ED Provider Notes (Signed)
CSN: [id number]     Arrival date & time 09/22/14  1253 History   First MD Initiated Contact with Patient 09/22/14 1547     Chief Complaint  Patient presents with  . Rectal Bleeding     (Consider location/radiation/quality/duration/timing/severity/associated sxs/prior Treatment) HPI Comments: Patient with a history of cirrhosis and esophageal varices presents with rectal bleeding. He states he was drinking alcohol last night and had 2 episodes of bloody stools. His last stool was more regular in nature. However he spent having some discomfort in his abdomen and some nausea. He denies any vomiting. He's had a history of past admissions for GI bleeding. He's had banding procedures done for variceal bleeding in the past. He denies any dizziness. He denies any chest pain or shortness of breath.  Patient is a 59 y.o. male presenting with hematochezia.  Rectal Bleeding Associated symptoms: abdominal pain   Associated symptoms: no dizziness, no fever and no vomiting     Past Medical History  Diagnosis Date  . Blood transfusion Sep 13, 2013  . Hypertension   . Anemia   . Anxiety   . Gout     hx of no recent flare ups  . Esophageal varices 11/29/12    Historical  . Cirrhosis 11/29/12    historical  . GERD (gastroesophageal reflux disease)   . Upper respiratory infection     finished antibiotic 11-18-2013, coughing up clear sputum, no temperature   Past Surgical History  Procedure Laterality Date  . Colonoscopy      2011-[facility]. Normal results per patient  . Upper gi endoscopy    . Esophagogastroduodenoscopy N/A 11/29/2012    Procedure: ESOPHAGOGASTRODUODENOSCOPY (EGD);  Surgeon: Cleotis Nipper, MD;  Location: [facility];  Service: Endoscopy;  Laterality: N/A;  . Esophagogastroduodenoscopy N/A 01/16/2013    Procedure: ESOPHAGOGASTRODUODENOSCOPY (EGD);  Surgeon: Jeryl Columbia, MD;  Location: [facility];  Service: Endoscopy;  Laterality: N/A;   ? bedside case  . Esophageal banding N/A  01/16/2013    Procedure: ESOPHAGEAL BANDING;  Surgeon: Jeryl Columbia, MD;  Location: [facility];  Service: Endoscopy;  Laterality: N/A;  . Esophagogastroduodenoscopy N/A 02/07/2013    Procedure: ESOPHAGOGASTRODUODENOSCOPY (EGD);  Surgeon: Jeryl Columbia, MD;  Location: [facility];  Service: Endoscopy;  Laterality: N/A;  . Esophageal banding N/A 02/07/2013    Procedure: ESOPHAGEAL BANDING;  Surgeon: Jeryl Columbia, MD;  Location: [facility];  Service: Endoscopy;  Laterality: N/A;  . Esophagogastroduodenoscopy N/A 09/13/2013    Procedure: ESOPHAGOGASTRODUODENOSCOPY (EGD);  Surgeon: Missy Sabins, MD;  Location: [facility];  Service: Endoscopy;  Laterality: N/A;  . Esophagogastroduodenoscopy (egd) with propofol N/A 10/10/2013    Procedure: ESOPHAGOGASTRODUODENOSCOPY (EGD) WITH PROPOFOL;  Surgeon: Jeryl Columbia, MD;  Location: [facility];  Service: Endoscopy;  Laterality: N/A;  . Esophageal banding N/A 10/10/2013    Procedure: ESOPHAGEAL BANDING;  Surgeon: Jeryl Columbia, MD;  Location: [facility];  Service: Endoscopy;  Laterality: N/A;  . Esophagogastroduodenoscopy (egd) with propofol N/A 12/11/2013    Procedure: ESOPHAGOGASTRODUODENOSCOPY (EGD) WITH PROPOFOL;  Surgeon: Jeryl Columbia, MD;  Location: [facility];  Service: Endoscopy;  Laterality: N/A;  . Esophageal banding N/A 12/11/2013    Procedure: ESOPHAGEAL BANDING;  Surgeon: Jeryl Columbia, MD;  Location: [facility];  Service: Endoscopy;  Laterality: N/A;  . Colonoscopy with propofol N/A 12/11/2013    Procedure: COLONOSCOPY WITH PROPOFOL;  Surgeon: Jeryl Columbia, MD;  Location: [facility];  Service: Endoscopy;  Laterality: N/A;   Family History  Problem Relation Age of Onset  . Hyperlipidemia Mother    History  Substance Use Topics  . Smoking status: Former Research scientist (life sciences)  . Smokeless tobacco: Never Used  . Alcohol Use: No     Comment: 6 pack per day for long time quit jan 09-2013    Review of Systems  Constitutional: Negative for fever, chills,  diaphoresis and fatigue.  HENT: Negative for congestion, rhinorrhea and sneezing.   Eyes: Negative.   Respiratory: Negative for cough, chest tightness and shortness of breath.   Cardiovascular: Negative for chest pain and leg swelling.  Gastrointestinal: Positive for nausea, abdominal pain, blood in stool and hematochezia. Negative for vomiting and diarrhea.  Genitourinary: Negative for frequency, hematuria, flank pain and difficulty urinating.  Musculoskeletal: Negative for back pain and arthralgias.  Skin: Negative for rash.  Neurological: Negative for dizziness, speech difficulty, weakness, numbness and headaches.      Allergies  Review of patient's allergies indicates no known allergies.  Home Medications   Prior to Admission medications   Medication Sig Start Date End Date Taking? Authorizing Provider  ALPRAZolam Duanne Moron) 0.5 MG tablet Take 1 tablet (0.5 mg total) by mouth 4 (four) times daily as needed for anxiety or sleep. 09/19/14   Thao P Le, DO  ferrous sulfate 325 (65 FE) MG tablet Take 1 tablet (325 mg total) by mouth 2 (two) times daily. 04/13/13   Thao P Le, DO  fluorouracil (EFUDEX) 5 % cream Apply 1 application topically daily as needed. For pre cancer spots    Historical Provider, MD  folic acid (FOLVITE) 1 MG tablet Take 1 tablet (1 mg total) by mouth daily. PATIENT NEEDS OFFICE VISIT/LABS FOR ADDITIONAL REFILLS 09/09/14   Thao P Le, DO  Multiple Vitamin (MULTIVITAMIN WITH MINERALS) TABS Take 1 tablet by mouth daily.    Historical Provider, MD  mupirocin ointment (BACTROBAN) 2 % Apply 1 application topically 2 (two) times daily. 07/05/14   Mancel Bale, PA-C  pantoprazole (PROTONIX) 40 MG tablet Take 1 tablet by mouth two  times daily 06/09/14   Thao P Le, DO  propranolol ER (INDERAL LA) 60 MG 24 hr capsule Take 1 capsule by mouth at  bedtime 08/01/14   Thao P Le, DO  thiamine 100 MG tablet Take 1 tablet (100 mg total) by mouth daily. 04/13/13   Thao P Le, DO  traZODone  (DESYREL) 50 MG tablet Take 0.5 tablets (25 mg total) by mouth at bedtime. 09/19/14   Thao P Le, DO   BP 125/94 mmHg  Pulse 81  Temp(Src) 98.7 F (37.1 C) (Oral)  Resp 18  SpO2 95% Physical Exam  Constitutional: He is oriented to person, place, and time. He appears well-developed and well-nourished.  HENT:  Head: Normocephalic and atraumatic.  Eyes: Pupils are equal, round, and reactive to light.  Neck: Normal range of motion. Neck supple.  Cardiovascular: Normal rate, regular rhythm and normal heart sounds.   Pulmonary/Chest: Effort normal and breath sounds normal. No respiratory distress. He has no wheezes. He has no rales. He exhibits no tenderness.  Abdominal: Soft. Bowel sounds are normal. There is tenderness (mild epigastric discomfort). There is no rebound and no guarding.  Genitourinary:  Rectal exam shows no gross blood or melena however was Hemoccult positive  Musculoskeletal: Normal range of motion. He exhibits no edema.  Lymphadenopathy:    He has no cervical adenopathy.  Neurological: He is alert and oriented to person, place, and time.  Skin: Skin is warm and dry.  No rash noted.  Psychiatric: He has a normal mood and affect.    ED Course  Procedures (including critical care time) Labs Review Results for orders placed or performed during the hospital encounter of 09/22/14  CBC  Result Value Ref Range   WBC 3.5 (L) 4.0 - 10.5 K/uL   RBC 4.84 4.22 - 5.81 MIL/uL   Hemoglobin 16.5 13.0 - 17.0 g/dL   HCT 46.9 39.0 - 52.0 %   MCV 96.9 78.0 - 100.0 fL   MCH 34.1 (H) 26.0 - 34.0 pg   MCHC 35.2 30.0 - 36.0 g/dL   RDW 16.3 (H) 11.5 - 15.5 %   Platelets 65 (L) 150 - 400 K/uL  Comprehensive metabolic panel  Result Value Ref Range   Sodium 135 135 - 145 mmol/L   Potassium 5.2 (H) 3.5 - 5.1 mmol/L   Chloride 106 96 - 112 mEq/L   CO2 26 19 - 32 mmol/L   Glucose, Bld 155 (H) 70 - 99 mg/dL   BUN 6 6 - 23 mg/dL   Creatinine, Ser 0.51 0.50 - 1.35 mg/dL   Calcium 8.5 8.4 -  10.5 mg/dL   Total Protein 7.5 6.0 - 8.3 g/dL   Albumin 3.8 3.5 - 5.2 g/dL   AST 114 (H) 0 - 37 U/L   ALT 48 0 - 53 U/L   Alkaline Phosphatase 273 (H) 39 - 117 U/L   Total Bilirubin 5.0 (H) 0.3 - 1.2 mg/dL   GFR calc non Af Amer >90 >90 mL/min   GFR calc Af Amer >90 >90 mL/min   Anion gap 3 (L) 5 - 15  Type and screen for Red Blood Exchange  Result Value Ref Range   ABO/RH(D) A NEG    Antibody Screen NEG    Sample Expiration 09/25/2014    No results found.    Imaging Review No results found.   EKG Interpretation None      MDM   Final diagnoses:  Gastrointestinal hemorrhage, unspecified gastritis, unspecified gastrointestinal hemorrhage type    Patient history of esophageal varices and liver cirrhosis presents with GI bleeding. Hemoglobin is stable but his Hemoccult is positive. He has no gross blood or melena on rectal exam. However given his history, I feel that he needs to be admitted for monitoring and serial hemoglobins. I'll consult the hospitalist for admission.    Malvin Johns, MD 09/22/14 [phone]

## 2014-09-22 NOTE — ED Notes (Signed)
Pt with Hx of hepatic cirrhosis and varices drank alcohol last night, this morning noticed nausea, emesis, rectal bleeding. Pt states underwear was bloodied.

## 2014-09-23 ENCOUNTER — Telehealth: Payer: Self-pay

## 2014-09-23 LAB — COMPREHENSIVE METABOLIC PANEL
ALBUMIN: 3.1 g/dL — AB (ref 3.5–5.2)
ALK PHOS: 200 U/L — AB (ref 39–117)
ALT: 38 U/L (ref 0–53)
AST: 78 U/L — ABNORMAL HIGH (ref 0–37)
Anion gap: 6 (ref 5–15)
BUN: 9 mg/dL (ref 6–23)
CALCIUM: 8.2 mg/dL — AB (ref 8.4–10.5)
CO2: 25 mmol/L (ref 19–32)
CREATININE: 0.54 mg/dL (ref 0.50–1.35)
Chloride: 108 mEq/L (ref 96–112)
GFR calc non Af Amer: >90 mL/min (ref >90)
Glucose, Bld: 93 mg/dL (ref 70–99)
POTASSIUM: 3.6 mmol/L (ref 3.5–5.1)
Sodium: 139 mmol/L (ref 135–145)
TOTAL PROTEIN: 6.1 g/dL (ref 6.0–8.3)
Total Bilirubin: 6.7 mg/dL — ABNORMAL HIGH (ref 0.3–1.2)

## 2014-09-23 LAB — CBC
HEMATOCRIT: 40 % (ref 39.0–52.0)
HEMOGLOBIN: 13.7 g/dL (ref 13.0–17.0)
MCH: 33.3 pg (ref 26.0–34.0)
MCHC: 34.3 g/dL (ref 30.0–36.0)
MCV: 97.1 fL (ref 78.0–100.0)
Platelets: 45 10*3/uL — ABNORMAL LOW (ref 150–400)
RBC: 4.12 MIL/uL — AB (ref 4.22–5.81)
RDW: 16.1 % — ABNORMAL HIGH (ref 11.5–15.5)
WBC: 2.1 10*3/uL — AB (ref 4.0–10.5)

## 2014-09-23 LAB — PROTIME-INR
INR: 1.77 — AB (ref 0.00–1.49)
Prothrombin Time: 20.8 seconds — ABNORMAL HIGH (ref 11.6–15.2)

## 2014-09-23 MED ORDER — HYDROXYZINE HCL 25 MG PO TABS: 25.0000 mg | ORAL_TABLET | Freq: Four times a day (QID) | ORAL | Status: AC | PRN

## 2014-09-23 MED ORDER — PROMETHAZINE HCL 12.5 MG PO TABS: 12.5000 mg | ORAL_TABLET | Freq: Four times a day (QID) | ORAL | Status: AC | PRN

## 2014-09-23 MED ORDER — ONDANSETRON 4 MG PO TBDP: 4.0000 mg | ORAL_TABLET | Freq: Four times a day (QID) | ORAL | Status: AC | PRN

## 2014-09-23 MED ORDER — LOPERAMIDE HCL 2 MG PO CAPS: 2.0000 mg | ORAL_CAPSULE | ORAL | Status: DC | PRN

## 2014-09-23 MED ORDER — FERROUS SULFATE 325 (65 FE) MG PO TABS: 325.0000 mg | ORAL_TABLET | Freq: Every day | ORAL | Status: AC

## 2014-09-23 NOTE — Progress Notes (Signed)
Assessment unchanged. Pt had normal BM with no s/o of bleeding noted. Denies pain. No complaints voiced. Verbalized understanding of dc instructions through teach back. Pt open to advice of Dr. Verlon Au and Primary RN to stop drinking alcohol. Outside resources available in community to help provided in AVS. No scripts at dc. Discharged via foot per pt request to meet awaiting vehicle to carry pt home. Accompanied by NT.

## 2014-09-23 NOTE — Discharge Summary (Signed)
Physician Discharge Summary  Jake [name] DJS:[id number] DOB: [birth year] DOA: 09/22/2014  PCP: Leotis Pain, DO  Admit date: 09/22/2014 Discharge date: 09/23/2014  Time spent: 35 minutes  Recommendations for Outpatient Follow-up:  1. Follow up with PCP 2. Follow up with GI 3. 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 chr ETOH, Cirrhosis c Varices s/p scope c VaricealBands x 4 12/2013 Dr. Watt Climes, Colonoscopy same day int/ext hemorrh-Polpy s/p cold snare, Htn, Anemia multifactorial Has had 2 prior life threatening bleed in 01/2013, 09/2013 States drank3 12 0z beer foll by another 11 x 12 0z  Presented to [facility] with concern for GI bleed Noted that the bleed was minimal   Sodium 135 potassium 5.2 anion gap 3 alkaline phosphatase 273 AST 114/ALT 48 [baseline alkaline phosphatase 250, baseline AST 114 baseline total bili 7.4] Total bili 5.0 Platelets 65, WBC 3.5, hemoglobin 16.5 Sex hormone binding 89, testosterone 737, free testosterone 1.1% [ ?] Recent TSH 3.29  Patient was admitted for OBs for serial Hb He experienced no furtherbleeding while hospitalized and in fact had no further stool on day of d/c He was repentant about his ETOH use and worries about being able to afford OP Endo/COlonoscopy i explained to him clearly effects of ETOH and discussed his MELD on Admit was 6%/3 mo-he understands I contrasted the affordability of his ETOH with medical care  I had a long discussion with him about endocrine effects of cirrhosis summarized best as below-He might benefit from TD Testosterone but  presents a slightly higher risk than average population for DVT/PE  Ultimately he should stop drinking!  CIWA score was 4 only  He wa sencouraged to contact PCP and get CBc + Cmet in 1 week   "Cirrhotis patients have normal median plasma testosterone concentrations, but 20% have values above and 20% have values below the normal limits. The majority of patients have raised sex hormone binding globulin (SHBG) concentrations. This increase accounts for the supranormal plasma testosterone concentrations. With decreasing liver function, plasma testosterone concentrations decrease significantly. The combination of increased SHBG levels and decreasing liver function leads to low or subnormal plasma concentrations of non-protein bound and non-SHBG bound testosterone"    Discharge Exam: Filed Vitals:   09/23/14 0525  BP: 114/68  Pulse: 64  Temp: 98.6 F (37 C)  Resp: 16    General: alert pleasant oriented Cardiovascular:  s1 s 2 no m/r/g Respiratory: clear  Discharge Instructions   Discharge Instructions    Diet - low sodium heart healthy    Complete by:  As directed      Discharge instructions    Complete by:  As directed   Stop drinking See Dr. Watt Climes for routine care Follow up c Dr. Marin Comment  Labs 1 week     Increase activity slowly    Complete by:  As directed           Current Discharge Medication List    START taking these medications   Details  hydrOXYzine (ATARAX/VISTARIL) 25 MG tablet Take 1 tablet (  25 mg total) by mouth every 6 (six) hours as needed for anxiety (or CIWA score </= 10). Qty: 30 tablet, Refills: 0    loperamide (IMODIUM) 2 MG capsule Take 1-2 capsules (2-4 mg total) by mouth as needed for diarrhea or loose stools. Qty: 30 capsule, Refills: 0    ondansetron (ZOFRAN-ODT) 4 MG disintegrating tablet Take 1 tablet (4 mg total) by mouth every 6 (six) hours as needed for nausea or vomiting. Qty: 20 tablet, Refills: 0    promethazine (PHENERGAN) 12.5 MG tablet  Take 1 tablet (12.5 mg total) by mouth every 6 (six) hours as needed for nausea. Qty: 30 tablet, Refills: 0      CONTINUE these medications which have CHANGED   Details  ferrous sulfate 325 (65 FE) MG tablet Take 1 tablet (325 mg total) by mouth daily with breakfast. Qty: 30 tablet, Refills: 3      CONTINUE these medications which have NOT CHANGED   Details  fluorouracil (EFUDEX) 5 % cream Apply 1 application topically daily as needed. For pre cancer spots    folic acid (FOLVITE) 1 MG tablet Take 1 tablet (1 mg total) by mouth daily. PATIENT NEEDS OFFICE VISIT/LABS FOR ADDITIONAL REFILLS Qty: 30 tablet, Refills: 1    Multiple Vitamin (MULTIVITAMIN WITH MINERALS) TABS Take 1 tablet by mouth daily.    mupirocin ointment (BACTROBAN) 2 % Apply 1 application topically 2 (two) times daily. Qty: 22 g, Refills: 0    pantoprazole (PROTONIX) 40 MG tablet Take 1 tablet by mouth two  times daily Qty: 180 tablet, Refills: 1    traZODone (DESYREL) 50 MG tablet Take 0.5 tablets (25 mg total) by mouth at bedtime. Qty: 30 tablet, Refills: 0      STOP taking these medications     ALPRAZolam (XANAX) 0.5 MG tablet      propranolol ER (INDERAL LA) 60 MG 24 hr capsule      thiamine 100 MG tablet        No Known Allergies    The results of significant diagnostics from this hospitalization (including imaging, microbiology, ancillary and laboratory) are listed below for reference.    Significant Diagnostic Studies: No results found.  Microbiology: No results found for this or any previous visit (from the past 240 hour(s)).   Labs: Basic Metabolic Panel:  Recent Labs Lab 09/19/14 1247 09/22/14 1314 09/23/14 0530  NA 139 135 139  K 4.3 5.2* 3.6  CL 102 106 108  CO2 27 26 25   GLUCOSE 95 155* 93  BUN 8 6 9   CREATININE 0.66 0.51 0.54  CALCIUM 8.9 8.5 8.2*   Liver Function Tests:  Recent Labs Lab 09/19/14 1247 09/22/14 1314 09/23/14 0530  AST 76* 114* 78*  ALT 36 48 38   ALKPHOS 250* 273* 200*  BILITOT 7.4* 5.0* 6.7*  PROT 6.9 7.5 6.1  ALBUMIN 3.6 3.8 3.1*    Recent Labs Lab 09/22/14 1728  LIPASE 54   No results for input(s): AMMONIA in the last 168 hours. CBC:  Recent Labs Lab 09/19/14 1247 09/22/14 1314 09/22/14 1728 09/23/14 0530  WBC 2.6* 3.5* 3.2* 2.1*  NEUTROABS  --   --  2.1  --   HGB 15.8 16.5 14.9 13.7  HCT 46.6 46.9 42.8 40.0  MCV 97.1 96.9 96.6 97.1  PLT 64* 65* 49* 45*   Cardiac Enzymes: No results for input(s): CKTOTAL, CKMB, CKMBINDEX, TROPONINI in the last 168 hours. BNP: BNP (last 3 results) No results for input(s): PROBNP in  the last 8760 hours. CBG: No results for input(s): GLUCAP in the last 168 hours.     SignedNita Sells  [facility] 09/23/2014, 9:53 AM

## 2014-09-23 NOTE — Telephone Encounter (Signed)
Pt of Dr. Marin Comment updating [place] on his condition states that he was discharged from [facility]  [place] today, and has an appt. the 18th with [facility]. He will follow up with her after this to make sure she has the records. He says he is feeling fine and taking all of his meds. Would like to know if a copy of his lab results can be left at 104 or give him another phone call. Please, call with further questions

## 2014-09-25 NOTE — Telephone Encounter (Signed)
Pt missed Dr. Gus Puma return call and would like for her to call him again. He wants to speak with her before she "mails something for him".

## 2014-09-26 ENCOUNTER — Telehealth: Payer: Self-pay | Admitting: Family Medicine

## 2014-09-26 ENCOUNTER — Other Ambulatory Visit: Payer: Managed Care, Other (non HMO)

## 2014-09-26 DIAGNOSIS — I8510 Secondary esophageal varices without bleeding: Secondary | ICD-10-CM

## 2014-09-26 DIAGNOSIS — R748 Abnormal levels of other serum enzymes: Secondary | ICD-10-CM

## 2014-09-26 DIAGNOSIS — K7460 Unspecified cirrhosis of liver: Secondary | ICD-10-CM

## 2014-09-26 DIAGNOSIS — K7030 Alcoholic cirrhosis of liver without ascites: Secondary | ICD-10-CM

## 2014-09-26 NOTE — Telephone Encounter (Signed)
Spoke with Jake [name] and his wife, will get [place] abd , will change it to complete abd, he does not want to do AA at this time, he has been through all of that and id not find it helpful, they will look at th eringer program adn see if this is an option for them and also if insurance. He would like to get a second opinion about his liver cirrhosis/esophageal varices so will refer to [place]

## 2014-09-26 NOTE — Telephone Encounter (Signed)
PLease tell him I will call him back today after clinic. But he needs to pick up if it is from our number [phone] or an unlisted number

## 2014-09-29 ENCOUNTER — Ambulatory Visit
Admission: RE | Admit: 2014-09-29 | Discharge: 2014-09-29 | Disposition: A | Discharge status: Home / Self Care | Payer: Managed Care, Other (non HMO) | Source: Ambulatory Visit | Attending: Family Medicine | Admitting: Family Medicine

## 2014-09-29 DIAGNOSIS — K7030 Alcoholic cirrhosis of liver without ascites: Secondary | ICD-10-CM

## 2014-09-30 ENCOUNTER — Telehealth: Payer: Self-pay

## 2014-09-30 NOTE — Telephone Encounter (Signed)
-----   Message from Glenford Bayley, DO sent at 09/30/2014 11:25 AM EST ----- Regarding: Cancel complete Abdomen US Can you cancel his complete abdomen US, I ordered it in hopes they could do it in place of the one he got yesterday but they did not .   Thanks, Dr Marin Comment

## 2014-10-01 NOTE — Telephone Encounter (Signed)
[place] complete abdomen canceled

## 2014-10-06 ENCOUNTER — Telehealth: Payer: Self-pay

## 2014-10-06 NOTE — Telephone Encounter (Signed)
Pt states he was referred to [facility] and would like to know if Dr Marin Comment got the report. Please call [phone]

## 2014-10-06 NOTE — Telephone Encounter (Signed)
Left a detailed message explaining message from Dr. Marin Comment. Advised pt to call me back if he needed to discuss with me.

## 2014-10-06 NOTE — Telephone Encounter (Signed)
Spoke with pt, I went over his ultrasound results with him in detail. He would just like to know if he can take magnesium citrate to help him go to the bathroom. He states he has a bottle at home but would like to check with you first before taking it. Also he wants to know if he can take some Ducolax at night too. Please advise.

## 2014-10-06 NOTE — Telephone Encounter (Signed)
Did he check his mychart, I sent a note with his results to him. I prefer him not to take magnesium citrate, however, he can take miralax twice a day, he can take dulcolax or senokot S.

## 2014-10-13 ENCOUNTER — Other Ambulatory Visit: Payer: Self-pay | Admitting: Family Medicine

## 2014-10-16 ENCOUNTER — Telehealth: Payer: Self-pay

## 2014-10-16 DIAGNOSIS — M549 Dorsalgia, unspecified: Secondary | ICD-10-CM

## 2014-10-16 NOTE — Telephone Encounter (Signed)
Pt of Dr. Marin Comment is trying to get her records from [facility] to Dr. Carlean Purl, [place] states he has been trying to get a hold of [facility], but has not gotten a call back in two weeks. Pt is having a lot of pain in abdomin and back, and would like to know what Dr. Marin Comment would suggest for pain, either over counter or rx? Please advise pt.

## 2014-10-17 MED ORDER — CYCLOBENZAPRINE HCL 5 MG PO TABS: 5.0000 mg | ORAL_TABLET | Freq: Three times a day (TID) | ORAL | Status: DC | PRN

## 2014-10-17 NOTE — Telephone Encounter (Signed)
Spoke with patient , he has some diffuse back pain that wraps around to his back, he has gas and also has had BM. Feels better after he has BM and pass flatulence. No fevers, chills, n/v, blood in stool. He has minimal distension and then is releived by BM/gas. PO intake is good. WIll rx some muscle relaxer but no narcotics and no tylenol and no NSAIDs for him. Advise to go to [facility] for eval prn but he says he will go to ER instead since he has met his deductible.   I don't know what is going on ..he has been referred to [facility] for a second opinion, he does not want to go back to Dr Mathews Robinsons office at this time.  Needs medical records to be sent from [place]'s office to [facility] before an initial appt can be made. He states that he had signed a release of medical info about 2-3 weeks ago at [employer]. I called an Dr Mathews Robinsons office says they never received it. So...ibuprofen have asked him to come in to sign another release at our office and we will try to coordinate the release of medical records. Will ask him to sign a  Release of medical records to our office, once I get it we will scan it and also fax over to [facility] at [phone], Wynelle Link in Medical Records.

## 2014-11-03 ENCOUNTER — Encounter: Payer: Self-pay | Admitting: Internal Medicine

## 2014-11-05 ENCOUNTER — Other Ambulatory Visit: Payer: Self-pay | Admitting: Family Medicine

## 2014-11-06 NOTE — Telephone Encounter (Signed)
Dr Marin Comment, do you want to RF?

## 2014-11-13 ENCOUNTER — Other Ambulatory Visit: Payer: Self-pay | Admitting: Family Medicine

## 2014-12-09 ENCOUNTER — Other Ambulatory Visit: Payer: Self-pay | Admitting: Family Medicine

## 2014-12-10 ENCOUNTER — Other Ambulatory Visit: Payer: Self-pay | Admitting: Radiology

## 2014-12-10 NOTE — Telephone Encounter (Signed)
Called in the Rx for Xanax/ it was not signed when it printed.

## 2014-12-22 ENCOUNTER — Telehealth: Payer: Self-pay

## 2014-12-22 NOTE — Telephone Encounter (Signed)
Refills on all of the meds prescribed on Jan 2 please send to:   [place] mail order [phone]

## 2014-12-24 NOTE — Telephone Encounter (Signed)
I sent pt a Mychart message and will also try to call him back.

## 2014-12-24 NOTE — Telephone Encounter (Signed)
There were no Rxs written on Jan 2 by [place] (or anyone else in [place]). Tried to call pt and wife and Ph #s are not working #s. Called local pharm to see if they have current #, and they do not, also no record of any Rxs from 09/13/14. I called [facility] mail order and they do have a different ph # [phone] for pt but do not have any reqs for meds for pt or h/o any being ordered there on 09/13/14. Tried to call pt x 2 and was busy.

## 2014-12-25 ENCOUNTER — Ambulatory Visit: Payer: Managed Care, Other (non HMO) | Admitting: Internal Medicine

## 2015-01-07 ENCOUNTER — Telehealth: Payer: Self-pay

## 2015-01-07 MED ORDER — CYCLOBENZAPRINE HCL 5 MG PO TABS: ORAL_TABLET | ORAL | Status: AC

## 2015-01-07 NOTE — Telephone Encounter (Signed)
[facility] called w/pt also on the line and asked for 1 mos RF of cyclobenzaprine to be sent to [facility] in [facility] until pt can get to see his specialist. He also stated he will call back to set up a f/up appt w/Dr Marin Comment. Sent RF.

## 2015-01-12 ENCOUNTER — Telehealth: Payer: Self-pay | Admitting: *Deleted

## 2015-01-12 ENCOUNTER — Other Ambulatory Visit: Payer: Self-pay

## 2015-01-12 NOTE — Telephone Encounter (Signed)
REFILL ON ZOLPIDEM 10 MG, ALPRAZOLAM 0.5 MG

## 2015-01-12 NOTE — Telephone Encounter (Signed)
Patient is calling to leave a message for Dr. Marin Comment. Patient states that he is out of town for work and there is a Copy in the area he is in. Patient wants to know if it's okay the be seen there. He is currently in [facility]. Please call patient at this number: [phone]. Patient states it's okay to leave a voice maill for this number.

## 2015-01-12 NOTE — Telephone Encounter (Signed)
Spoke with patient. Advise to stop eating greasy foods, eat bland diet, hydrate. Stop drinking alcohol. Will see GI in [employer] where he is working on [place] . Needs refills of Alprazolam, zolpidem, loperamide, and also folic acid. Please send in to [facility] mail order pharmacy @ [phone]. I was unable to find this pharmacy and I thin his refills were sent to [facility] but should actually be going to [facility] order. He had to cancel with [facility] on April 14 since he had a job offer in [facility] , will return to [place] in September.

## 2015-01-12 NOTE — Telephone Encounter (Signed)
Spoke with pt--he is having irregular bowel movements, constipation and diarrhea. He states it is yellow. I told him he needs to be seen in either a hospital or urgent care. Pt understood. i told him i would let you know.

## 2015-01-12 NOTE — Telephone Encounter (Signed)
[place] controlled substance profile pulled. No illegal acitvities.

## 2015-01-13 MED ORDER — LOPERAMIDE HCL 2 MG PO CAPS: 2.0000 mg | ORAL_CAPSULE | ORAL | Status: AC | PRN

## 2015-01-13 MED ORDER — FOLIC ACID 1 MG PO TABS: 1.0000 mg | ORAL_TABLET | Freq: Every day | ORAL | Status: AC

## 2015-01-13 NOTE — Telephone Encounter (Signed)
I don't see an Rx for Ambien. Please order and sign. Xanax pended also. I have to fax in.

## 2015-01-13 NOTE — Telephone Encounter (Signed)
Refills Xanax Zolpidem Loperamide Folic acid   [facility] Mail order pharmacy

## 2015-01-14 NOTE — Telephone Encounter (Signed)
Dr Marin Comment, pt also wanted you to know that he HAS made an appt w/ [facility] down in [facility] for 02/12/15. See med reqs below.

## 2015-01-14 NOTE — Telephone Encounter (Signed)
Pt called back w/[employer] also on the phone, checking on status of Ambien, Xanax and propranolol. I pended all three for signature since you will be in today, Dr Marin Comment. I wanted to check to see quantity and RFs you wanted to send since your note states pt will be back in [place] in Sept? I advised pt that you will be in today to sign Rxs and then we will fax, or I will call him if there are any problems with this.

## 2015-01-14 NOTE — Telephone Encounter (Signed)
Duplicate message. See Refill enc 5/2.

## 2015-01-15 ENCOUNTER — Telehealth: Payer: Self-pay | Admitting: Radiology

## 2015-01-15 ENCOUNTER — Other Ambulatory Visit: Payer: Self-pay | Admitting: Family Medicine

## 2015-01-15 DIAGNOSIS — F411 Generalized anxiety disorder: Secondary | ICD-10-CM

## 2015-01-15 DIAGNOSIS — G4700 Insomnia, unspecified: Secondary | ICD-10-CM

## 2015-01-15 MED ORDER — ALPRAZOLAM 0.5 MG PO TABS: ORAL_TABLET | ORAL | Status: DC

## 2015-01-15 MED ORDER — ZOLPIDEM TARTRATE 10 MG PO TABS: 10.0000 mg | ORAL_TABLET | Freq: Every evening | ORAL | Status: DC | PRN

## 2015-01-15 MED ORDER — PROPRANOLOL HCL ER 60 MG PO CP24: 60.0000 mg | ORAL_CAPSULE | Freq: Every day | ORAL | Status: AC

## 2015-01-15 MED ORDER — ALPRAZOLAM 0.5 MG PO TABS: ORAL_TABLET | ORAL | Status: AC

## 2015-01-15 MED ORDER — ZOLPIDEM TARTRATE 10 MG PO TABS: 10.0000 mg | ORAL_TABLET | Freq: Every evening | ORAL | Status: AC | PRN

## 2015-01-15 NOTE — Telephone Encounter (Signed)
Faxed to [facility] order pharmacy.

## 2015-02-10 ENCOUNTER — Telehealth: Payer: Self-pay

## 2015-02-10 NOTE — Telephone Encounter (Signed)
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  His gastrologist is in need of his records for an upcoming appointment on Thursday.   He is contacting them to sign the medical release form.   [facility]  [facility] [address], [place], [place] Fax [place] - phone [phone]

## 2015-02-11 NOTE — Telephone Encounter (Signed)
Went ahead and faxed all his records over to Santiago Glad, also called her and let her know we had not gotten an ROI yet and see if she could re-fax it to [place]. Waiting on that to come in.

## 2015-02-11 NOTE — Telephone Encounter (Signed)
Received fax from Santiago Glad at [facility], scanned into the release that I had already sent via fax. All completed.

## 2015-02-17 ENCOUNTER — Other Ambulatory Visit: Payer: Self-pay

## 2015-02-17 MED ORDER — PANTOPRAZOLE SODIUM 40 MG PO TBEC: DELAYED_RELEASE_TABLET | ORAL | Status: AC

## 2015-05-14 DEATH — deceased

## 2015-06-07 IMAGING — US US ABDOMEN LIMITED
1 series · 14 of 25 positions shown · non-contrast
Comparison: CT 02/04/2009

CLINICAL DATA: Alcoholic cirrhosis.

EXAM:
US ABDOMEN LIMITED - RIGHT UPPER QUADRANT

[Series 1: us abdomen limited · 0.32mm/px · 14 of 60 slices shown]
[im 1/60]
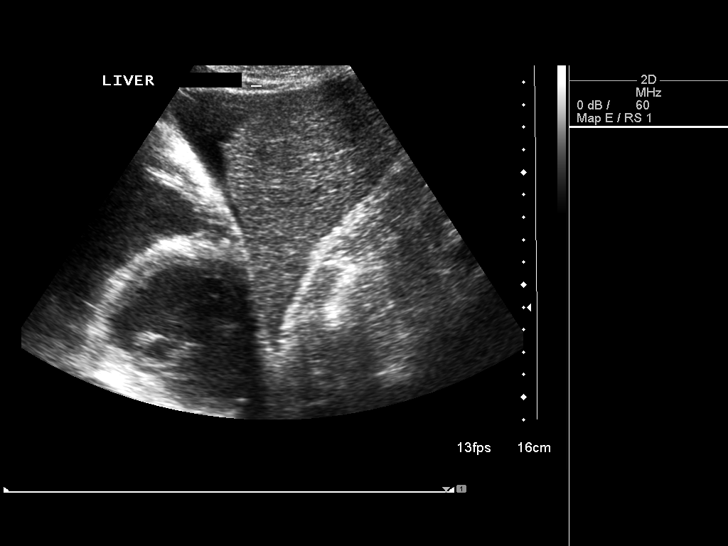
[im 5/60]
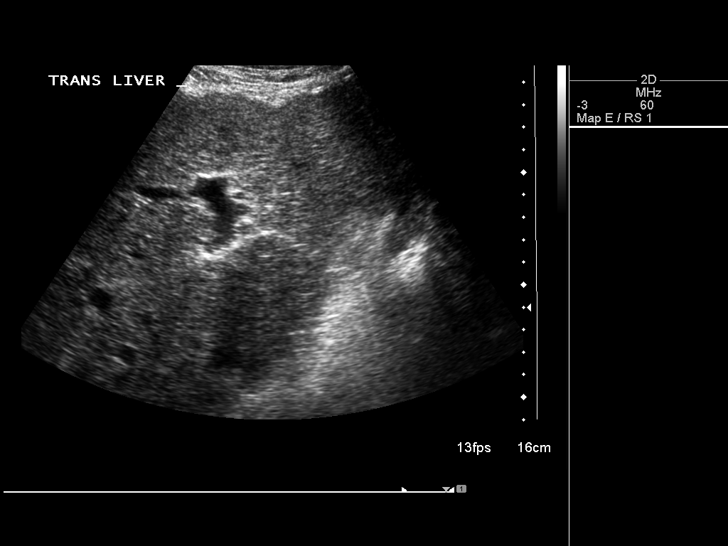
[im 10/60]
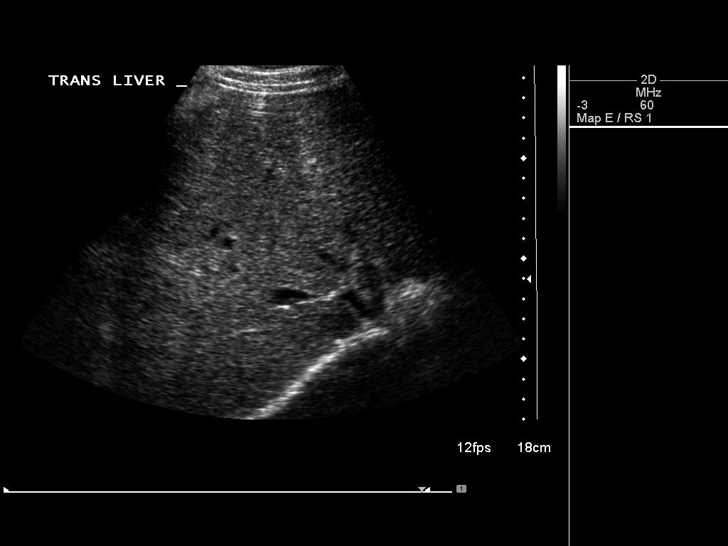
[im 15/60]
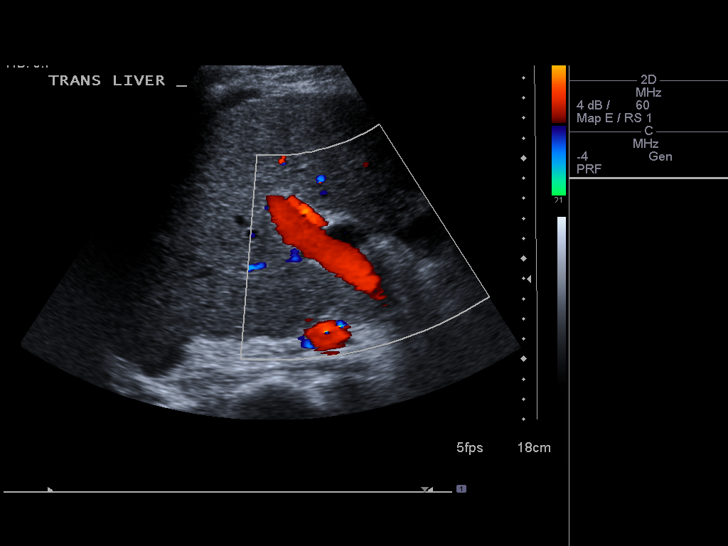
[im 20/60]
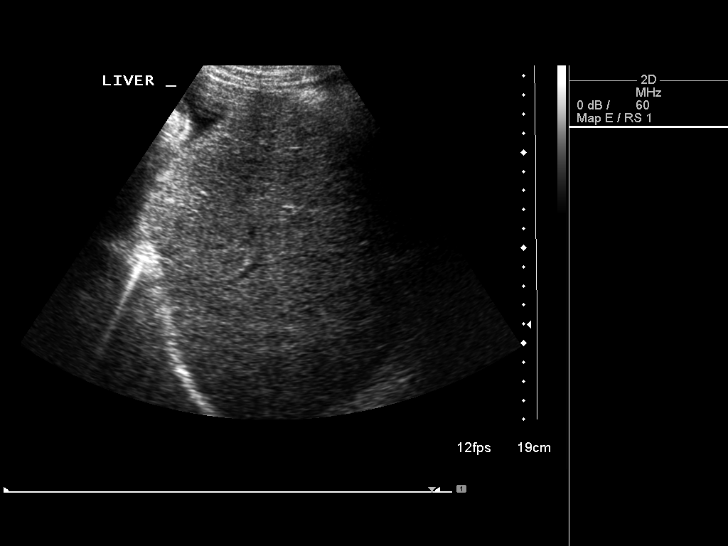
[im 23/60]
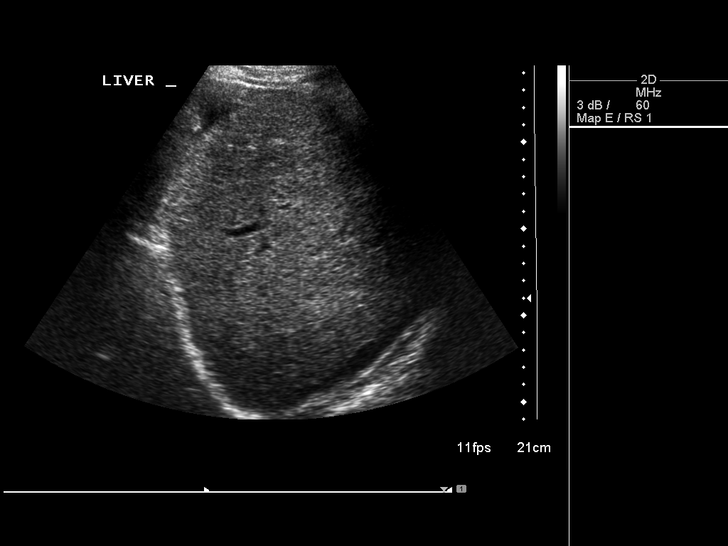
[im 28/60]
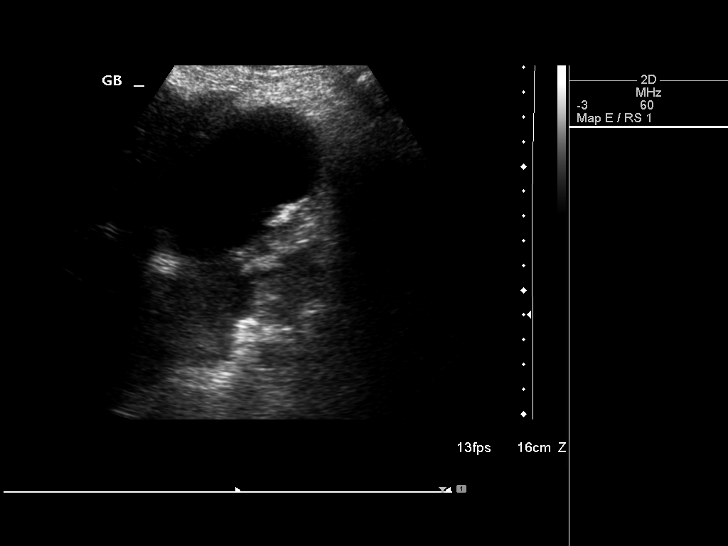
[im 32/60]
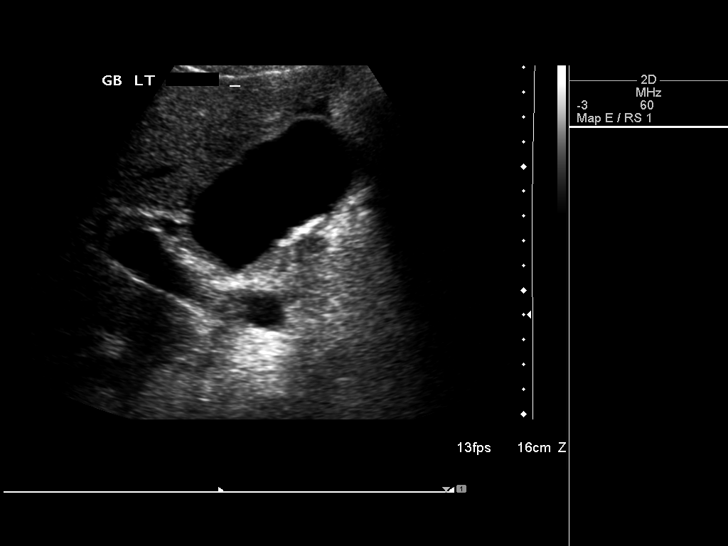
[im 37/60]
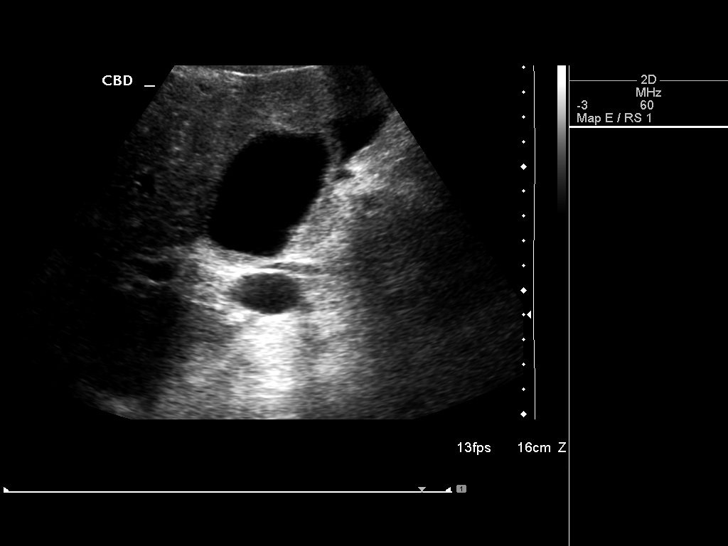
[im 40/60]
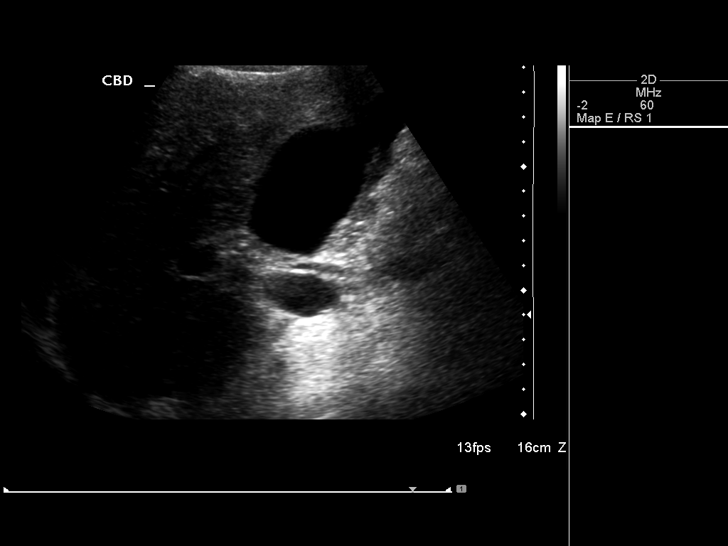
[im 45/60]
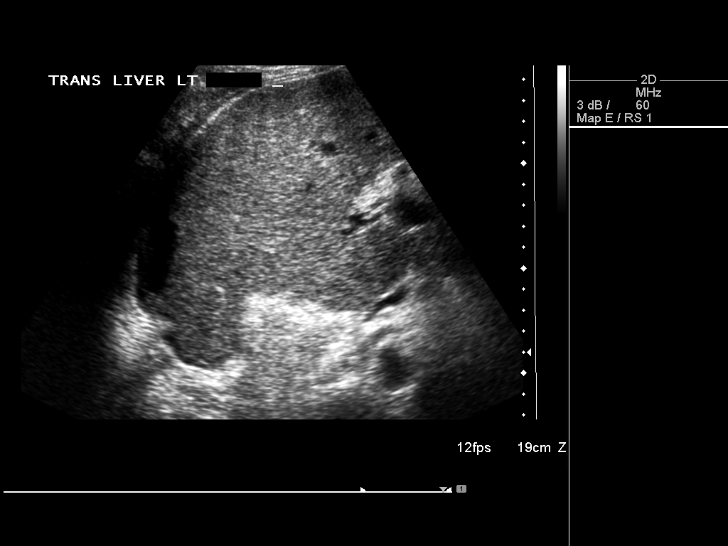
[im 50/60]
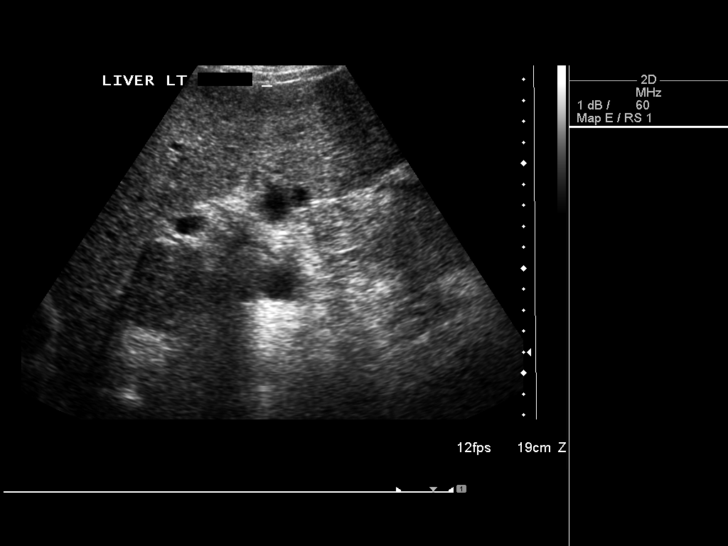
[im 55/60]
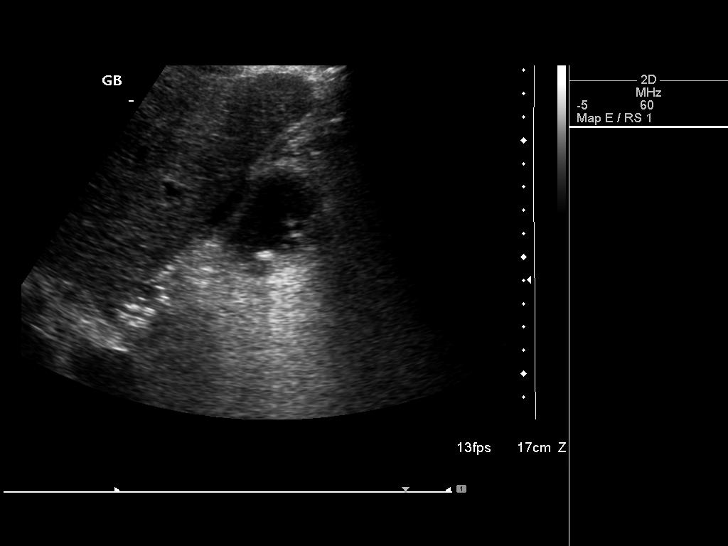
[im 60/60]
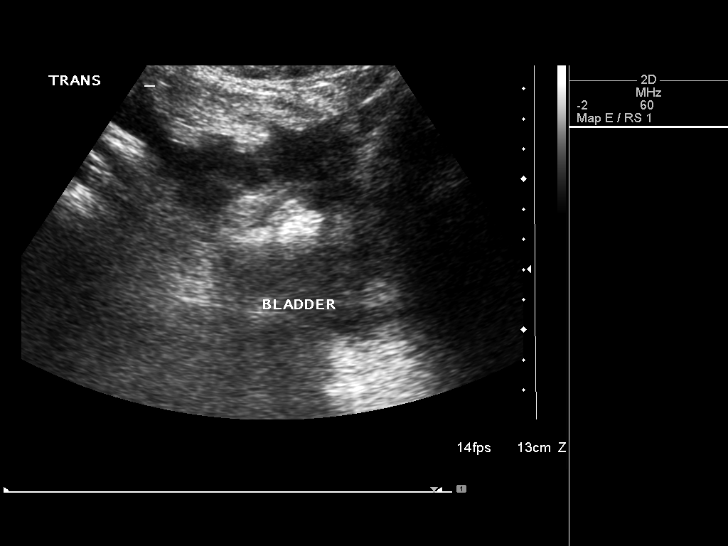

[14 of 25 positions shown; findings below may reference images not displayed]

FINDINGS: Gallbladder:

Single mobile 5 mm dependent stone. No gallbladder wall thickening.
Negative sonographic Murphy sign.

Common bile duct:

Diameter: 4.6 mm.

Liver:

Mild diffuse coarse echotexture compatible with hepatic steatosis
with mild nodular contour. No focal mass.

Small amount of ascites.
IMPRESSION: Single 5 mm gallstone without additional sonographic evidence to
suggest acute cholecystitis.

Findings suggesting mild cirrhosis and evidence of mild associated
ascites.
